# Patient Record
Sex: Male | Born: 1955 | Race: White | Hispanic: No | Marital: Married | Smoking: Never smoker
Health system: Southern US, Community
[De-identification: ages and names within clinical notes are randomized; demographics above are authoritative.]

## PROBLEM LIST (undated history)

## (undated) DIAGNOSIS — M1990 Unspecified osteoarthritis, unspecified site: Secondary | ICD-10-CM

## (undated) DIAGNOSIS — K219 Gastro-esophageal reflux disease without esophagitis: Secondary | ICD-10-CM

## (undated) DIAGNOSIS — R519 Headache, unspecified: Secondary | ICD-10-CM

## (undated) DIAGNOSIS — C801 Malignant (primary) neoplasm, unspecified: Secondary | ICD-10-CM

## (undated) DIAGNOSIS — R51 Headache: Secondary | ICD-10-CM

## (undated) DIAGNOSIS — F329 Major depressive disorder, single episode, unspecified: Secondary | ICD-10-CM

## (undated) DIAGNOSIS — E119 Type 2 diabetes mellitus without complications: Secondary | ICD-10-CM

## (undated) DIAGNOSIS — F419 Anxiety disorder, unspecified: Secondary | ICD-10-CM

## (undated) DIAGNOSIS — F32A Depression, unspecified: Secondary | ICD-10-CM

## (undated) DIAGNOSIS — I10 Essential (primary) hypertension: Secondary | ICD-10-CM

## (undated) DIAGNOSIS — Z8489 Family history of other specified conditions: Secondary | ICD-10-CM

## (undated) DIAGNOSIS — E785 Hyperlipidemia, unspecified: Secondary | ICD-10-CM

## (undated) HISTORY — PX: TIBIA FRACTURE SURGERY: SHX806

---

## 2005-06-30 ENCOUNTER — Inpatient Hospital Stay: Payer: Self-pay | Admitting: Internal Medicine

## 2005-06-30 ENCOUNTER — Other Ambulatory Visit: Payer: Self-pay

## 2005-07-03 ENCOUNTER — Ambulatory Visit: Payer: Self-pay | Admitting: Internal Medicine

## 2014-02-26 ENCOUNTER — Inpatient Hospital Stay: Payer: Self-pay | Admitting: Internal Medicine

## 2014-05-14 NOTE — Consult Note (Signed)
PATIENT NAME:  Travis Ford, Travis Ford MR#:  [id number] DATE OF BIRTH:  [birth year]  DATE OF CONSULTATION:  02/26/2014  CONSULTING PHYSICIAN:  Timoteo Gaul, MD  REASON FOR CONSULTATION: Dog bite, right hand.   HISTORY OF PRESENT ILLNESS: Mr. Stetzer is a 59 year old male who presents with redness and swelling of his right hand after being bitten by his own dog on Friday, 02/24/2014. He states he was holding a toy and he and the dog were having a tug-of-war. The dog became aggressive at going after the toy and accidentally bit the patient in the right hand along the radial border of the thumb near the thenar eminence and CMC joint. The patient states that the next day, the hand became more swollen and last night he had severe pain in the right thumb. He states he has had an upper respiratory infection for the past several days, but felt very poorly this morning which is what precipitated him to come to the emergency department.   HOME MEDICATIONS: Include metformin 500 mg 1 tablet b.i.d. and hydrochlorothiazide 1 tablet daily, hydrochlorothiazide/lisinopril combination is 12.5/10 mg,   PAST MEDICAL HISTORY: Includes hypertension of diabetes.   ALLERGIES: No known drug allergies.   SOCIAL HISTORY: The patient lives at home with his wife and kids.   PHYSICAL EXAMINATION:  GENERAL: The patient is alert and oriented, in no acute distress. He is able to give an accurate history.   VITAL SIGNS: The patient is noted to be febrile in the emergency department. His temperature is 102.4. He is tachycardic with a pulse rate of 104. His blood pressure is 112/66 and respirations of 18. There is a 93% pulse oximetry on room air.   EXTREMITIES: Right hand: The patient has swelling over the thenar eminence, wrapping around the base of the thumb to the dorsal web space. He can flex and extend the thumb and all other digits of the right hand without pain. He has wrist without pain as well. He has full elbow shoulder  range of motion. He has a streaking erythema over volar aspect of his forearm, which extends approximately three quarters of the way up his arm. The patient's forearm compartments and hand compartments are soft and compressible despite the swelling. He has a palpable radial pulse and his fingers are well-perfused. He has intact sensation to light touch in all digits.   RADIOLOGY: X-ray films of the right hand including AP, lateral and oblique were reviewed. This shows no evidence of foreign body, fracture, dislocation or other osseous abnormality.   ASSESSMENT: Right hand cellulitis with ascending erythema.   PLAN: The patient does not show evidence of a septic joint at this time in either his right thumb or wrist. I believe he has cellulitis. He has swelling, but no focal area of fluctuance consistent with an abscess. I agree with the plan of the hospitalist service admitting for IV antibiotics. I discussed this with Dr. Reece Levy, the admitting hospitalist. The patient has received clindamycin after blood cultures were taken in the emergency room. He will have Unasyn added to his antibiotics. He has a white count of 16.7. The patient also has a serum glucose of 326. He has been placed on an insulin sliding scale. The patient does not appear septic. His fever may be related to the upper respiratory infection as well. I will continue to follow him along with the hospitalist service. If he does not improve on the IV antibiotics overnight, then an MRI would be considered to  evaluate for a focal abscess that may be surgically drained. The patient understood and agreed with this plan.     ____________________________ Timoteo Gaul, MD klk:TT D: 02/26/2014 18:17:49 ET T: 02/26/2014 18:42:51 ET JOB#: [id number]  cc: Timoteo Gaul, MD, <Dictator> Timoteo Gaul MD ELECTRONICALLY SIGNED 03/08/2014 14:46

## 2014-05-14 NOTE — Consult Note (Signed)
PATIENT NAME:  Travis Ford, LOCKLIN MR#:  [id number] DATE OF BIRTH:  1955/12/02  DATE OF CONSULTATION:  02/27/2014 Relatively healthy male with DM  who was playing with his pet dog when he had a minor scratch from the dog's teeth on his right hand. This occurred 3 days ago. The area became red, swollen and painful. The patient also started to have some subjective fevers and felt nauseous. He has also had some sinus symptoms predating this. The patient was noted to have an elevated white count in the ER and was admitted for IV antibiotics. He had some streaking up his arm at that time. He was given a tetanus shot and has been started on IV antibiotics. He has been seen by Dr. Mack Guise in orthopedics. He has had an MRI done, which does not show any evidence of necrotizing fasciitis or osteomyelitis. The dog is up-to-date on his vaccines.   PAST MEDICAL HISTORY:  1. Hypertension.  2. Diabetes. Per his wife, it is poorly controlled.  3. Obesity.   PAST SURGICAL HISTORY: None.   ALLERGIES: NO KNOWN DRUG ALLERGIES.   ANTIBIOTICS SINCE ADMISSION: Include Unasyn and clindamycin.   FAMILY HISTORY: Mother with hypertension and heart problems.   SOCIAL HISTORY: He is married and lives with his wife. He is a Armed forces operational officer. He does not smoke. He uses alcohol only very occasionally.   REVIEW OF SYSTEMS: 11 systems reviewed and negative, except as per history of present illness.   PHYSICAL EXAMINATION:  VITAL SIGNS: Temperature is afebrile, pulse is 90, down from 107, blood pressure 124/72, respirations 19, saturation 94%.  GENERAL: He is obese. No acute distress.  HEENT: Pupils equal, round, and reactive to light and accommodation. Sclerae anicteric. Oropharynx clear.  NECK: Supple.  HEART: Regular.  LUNGS: Clear.  ABDOMEN: Soft, nontender, nondistended. No hepatosplenomegaly.  EXTREMITIES: With 1+ edema bilaterally. Right upper extremity, he has edema over the dorsum of his hand. There is an area  of hemorrhagic bullae over the  hypothenar area. He has a small amount of serosanguineous purulence that I am able to express. There is mild streaking up the inner ear arm, which is not extending beyond the borders drawn yesterday.   DATA: White blood count 16.7 on 02/26/2013, 16.1 on 02/27/2014. Platelets are normal. Blood cultures x2 from 02/26/2014 no growth to date. Renal function shows a creatinine of 1.32 on 02/27/2013 up from 1.19 on 02/26/2014.   IMAGING: MRI of the right hand shows cellulitis of the head without osteomyelitis or abscess.   IMPRESSION: A 59 year old gentleman with history of poorly controlled diabetes, admitted with cellulitis and streaking in the arm after a bite by a dog. There is some hemorrhagic bullae. White count is 16. He is afebrile and heart rate and blood pressure stable. MRI is negative for abscess or osteomyelitis.   RECOMMENDATIONS:  1. Continue Unasyn and clindamycin.  2. Elevate the arm.  3. As this improves, he can potentially be switched to oral Augmentin and clindamycin and discharged with a total of 10 to 14 days of antibiotics.  4. Thank you for the consult. I will be glad to follow with you.  ____________________________ Cheral Marker. Ola Spurr, MD dpf:ap D: 02/27/2014 14:38:15 ET T: 02/27/2014 15:35:36 ET JOB#: [id number]  cc: Cheral Marker. Ola Spurr, MD, <Dictator> DAVID Ola Spurr MD ELECTRONICALLY SIGNED 03/16/2014 19:46

## 2014-05-14 NOTE — Discharge Summary (Signed)
PATIENT NAME:  Travis Ford, Travis Ford MR#:  [id number] DATE OF BIRTH:  30-Dec-1955  DATE OF ADMISSION:  02/26/2014 DATE OF DISCHARGE:  03/04/2014  DISCHARGE DIAGNOSES:  1.  Hand cellulitis due to group A streptococcus.   2.  Diabetes.  3.  Hypertension.   HISTORY OF PRESENT ILLNESS: Please see initial history and physical for details. Briefly, this gentleman was admitted following a dog bite to his hand. The area had become markedly infected. The patient was admitted, started on antibiotics. He was followed by orthopedics and infectious disease. He had 2 MRIs done over the course of this hospitalization that did not reveal any focal abscess. He defervesced and his white count improved; however, he continued to have impressive infection of the hand. Decision was made to discharge on ceftriaxone and clindamycin for the group A strep, which was identified. The patient also had his diabetes and hypertension managed with his outpatient medications.    DISCHARGE MEDICATIONS: Please see [facility] physician discharge summary.   DISCHARGE FOLLOWUP: The patient will follow up with Dr. Ola Spurr and Dr. Ouida Sills within 1-2 weeks.   DISCHARGE DIET: Diabetic ADA diet.   DISCHARGE INSTRUCTIONS: Continue to elevate the arm. PICC care per protocol. IV antibiotics per antibiotic orders. He will call or return if his hand worsens.   TIME SPENT: This discharge took 35 minutes.    ____________________________ Cheral Marker. Ola Spurr, MD dpf:bm D: 03/21/2014 19:54:02 ET T: 03/22/2014 02:11:37 ET JOB#: [id number]  cc: Cheral Marker. Ola Spurr, MD, <Dictator> Deniya Craigo Ola Spurr MD ELECTRONICALLY SIGNED 03/22/2014 7:12

## 2014-05-14 NOTE — Consult Note (Signed)
Brief Consult Note: Diagnosis: Right hand cellulitis following dog bite.   Patient was seen by consultant.   Consult note dictated.   Discussed with Attending MD.   Comments: Discussed with Dr. Reece Levy.  Patient with right hand cellulitis with streaking erythema in the right volar forearm consistent with cellulltis after being bit by his dog on Friday.  Patient does not have significant pain with movement of his thumb or wrist and I do not suspect a septic joint at this time.  No surgery is indicated at this time.  I agree with medicine admitting him for IV antibiotics.  I will recheck the patient in the AM.  I am ordering that he be NPO after midnight in case his condition worsens overnight.  If he fails to respond to the IV antibiotics, an MRI will be ordered to evaluate for an abscess.  Electronic Signatures: Thornton Park (MD)  (Signed 14-Feb-16 18:08)  Authored: Brief Consult Note   Last Updated: 14-Feb-16 18:08 by Thornton Park (MD)

## 2014-05-14 NOTE — H&P (Signed)
PATIENT NAME:  Travis Ford, Travis Ford MR#:  [id number] DATE OF BIRTH:  [birth year]  DATE OF ADMISSION:  02/26/2014  REFERRING PHYSICIAN: Verdia Kuba. Paduchowski, MD  PRIMARY CARE PHYSICIAN: Ocie Cornfield. Ouida Sills, MD, of [facility].   ADMITTING PHYSICIAN: Juluis Mire, MD   CHIEF COMPLAINT: Pain, redness and swelling of the right hand following dog bite, sustained 2 days ago. Fever of 1 day duration.   HISTORY OF PRESENT ILLNESS: A 59 year old, Caucasian male with a past medical history of diabetes mellitus type 2, hypertension, presents to the emergency room with complaints of pain, swelling and redness of right hand following sustaining a dog bite 2 days ago by his own dog. The patient states that he has a minor dog bite, which was sustained on Friday, following which he developed redness, pain, and swelling, which gradually increased today; hence, came to the emergency room for further evaluation. The patient also gives a history of fever, which started today. Has had some nausea, but no vomiting. He does have some dry cough with some nasal congestion, upper respiratory symptoms for the past few days. Otherwise, denies any sputum. No chest pain. No vomiting, no diarrhea, no urinary symptoms. In the emergency room, the patient was evaluated by the ED physician and was noted to have elevated white blood cell count and elevated temperature. The hospital service was consulted for further evaluation and management. Orthopedic on-call was also consulted by the ED physician, who saw the patient and advised admission to the medical team with IV antibiotics for now and they will follow him up.   PAST MEDICAL HISTORY: 1. Diabetes mellitus, type 2.  2. Hypertension.   PAST SURGICAL HISTORY: No history of any surgeries in the past.   ALLERGIES: No known drug allergies.   HOME MEDICATIONS:  1. Lisinopril 10 mg 1 tablet orally once a day.  2. Metformin 500 mg 1 tablet orally 2 times a day.  3. Aspirin 325  mg 1 tablet orally once a day.   FAMILY HISTORY: Mother with hypertension. No history of any diabetes, heart problems.   SOCIAL HISTORY: He is married and lives with his wife. He is a Armed forces operational officer. Denies any history of smoking, alcohol or substance abuse.   REVIEW OF SYSTEMS:  CONSTITUTIONAL: Positive for fever of 1 day duration. No chills. No fatigue. No generalized weakness.   EYES: Negative for blurred vision, double vision. No pain. No redness. No discharge.   EARS, NOSE, AND THROAT: Negative for tinnitus, ear pain, hearing loss, epistaxis. He does have some mild nasal congestion.   RESPIRATORY: Positive for dry cough. No sputum. No dyspnea. No wheezing. No hemoptysis. No painful respirations.   CARDIOVASCULAR: Negative for chest pain, palpitations, dizziness, syncopal episodes, orthopnea, dyspnea on excisional exertion, or pedal edema.   GASTROINTESTINAL: Positive for nausea, but no vomiting. No diarrhea. No abdominal pain. No hematemesis. No rectal bleeding. No GERD symptoms.   GENITOURINARY: Negative for dysuria, frequency, urgency, or hematuria.   ENDOCRINE: Negative for polyuria, nocturia, heat or cold intolerance.   HEMATOLOGIC AND LYMPHATIC: Negative for anemia, easy bruising or bleeding, or swollen glands.   INTEGUMENTARY: Positive for redness with pain and swelling of the right hand as noted in the history of present illness with a streak extending up the right forearm.   MUSCULOSKELETAL: Negative for neck pain or back pain. No history of arthritis or gout.   NEUROLOGICAL: Negative for focal weakness or numbness. No history of CVA, TIA or seizure disorder.  PSYCHIATRIC: Negative for anxiety, insomnia, depression.   PHYSICAL EXAMINATION: VITAL SIGNS: Temperature on arrival 98.3 degrees Fahrenheit, current temperature 102.4 degrees Fahrenheit, pulse rate 117 per minute, respirations 18 per minute, blood pressure 132/74, oxygen saturation 98% on room air.    GENERAL: Well-developed, well-nourished. Alert and oriented, in no acute distress, comfortably resting in the bed.   HEAD: Atraumatic, normocephalic.   EYES: Pupils are equal, react to light and accommodation. No conjunctival pallor. No icterus. Extraocular movements intact.   NOSE: No drainage. No lesions.   EARS: No drainage. No external lesions.   ORAL CAVITY: No mucosal lesions. No exudates.   NECK: Supple. No JVD. No thyromegaly. No carotid bruits. Range of motion of neck within normal limits.   RESPIRATORY: Good respiratory effort. Not using accessory muscles of respiration. Bilateral vesicular breath sounds. No rales or rhonchi.   CARDIOVASCULAR: S1, S2 regular. No murmurs, gallops, clicks or rubs. The pulses equal at carotid, femoral and pedal pulses. No peripheral edema.   ABDOMEN: Soft, obese and nontender. No hepatosplenomegaly. No masses. No guarding. No rigidity. Bowel sounds present and equal in all 4 quadrants.    GENITOURINARY: Deferred.  MUSCULOSKELETAL: No joint tenderness or effusion. Range of motion is adequate. Strength and tone equal bilaterally.   SKIN: Inspection with redness with localized edema with some abrasion with bruising on the right hand present. A red streak going from the hand up the forearm, up to the elbow on the lateral side of the right arm present.   LYMPHATIC: No cervical lymphadenopathy.   VASCULAR: Good dorsalis pedis and posterior tibial pulses.   NEUROLOGIC: Alert, awake and oriented x 3. Cranial nerves 2 through 12 grossly intact. No sensory deficit. No motor deficit. Motor strength is 5/5 bilateral. Deep tendon reflexes 2+ bilateral symmetrical.   PSYCHIATRIC: Alert, awake and oriented x 3. Judgment and insight adequate. Memory and mood within normal limits.   LABORATORY DATA: Serum glucose 326, BUN 14, creatinine 1.19, sodium 132, potassium 3.7, chloride 97, bicarbonate 27, total calcium 9.3, WBC 16.7, hemoglobin 16.1, hematocrit  49.2, platelet count 202,000.   IMAGING STUDIES: X-ray of the right hand with soft tissue swelling without underlying acute bony or joint abnormality.   ASSESSMENT AND PLAN: A 59 year old, Caucasian male with a past medical history of diabetes mellitus type 2 and hypertension who presents with right hand redness, swelling, pain and fever following a dog bite sustained on 02/24/2014, noted to have elevated temperatures with leukocytosis consistent with cellulitis of the right hand.   1. Right hand cellulitis following dog bite sustained 2 days ago. The patient is stable clinically. Admit to medical floor. Blood cultures obtained. IV antibiotics Unasyn and clindamycin. Follow up temperatures. Pain control medications. Follow up CBC. The patient seen by orthopedics in the emergency department and we will keep him n.p.o. except medications, as per orthopedic's request. Follow up with orthopedics as suggested.  2. Diabetes mellitus, type 2: Stable on oral medications. Continue sliding scale insulin.  3. Hypertension, controlled on home medications. Continue same. Follow blood pressure.  4. Deep vein thrombosis prophylaxis with subcutaneous Lovenox.  5. Gastrointestinal prophylaxis: Proton pump inhibitor.   CODE STATUS: Full code.   TIME SPENT: 45 minutes.     ____________________________ Juluis Mire, MD enr:TT D: 02/26/2014 17:57:50 ET T: 02/26/2014 18:27:52 ET JOB#: [id number]  cc: Juluis Mire, MD, <Dictator> Ocie Cornfield. Ouida Sills, MD Juluis Mire MD ELECTRONICALLY SIGNED 02/27/2014 12:35

## 2015-01-14 HISTORY — PX: INCISION AND DRAINAGE: SHX5863

## 2016-02-13 ENCOUNTER — Encounter
Admission: RE | Admit: 2016-02-13 | Discharge: 2016-02-13 | Disposition: A | Discharge status: Home / Self Care | Payer: BC Managed Care – PPO | Source: Ambulatory Visit | Attending: Orthopedic Surgery | Admitting: Orthopedic Surgery

## 2016-02-13 DIAGNOSIS — M1611 Unilateral primary osteoarthritis, right hip: Secondary | ICD-10-CM | POA: Insufficient documentation

## 2016-02-13 DIAGNOSIS — R9431 Abnormal electrocardiogram [ECG] [EKG]: Secondary | ICD-10-CM | POA: Diagnosis not present

## 2016-02-13 DIAGNOSIS — Z01810 Encounter for preprocedural cardiovascular examination: Secondary | ICD-10-CM | POA: Diagnosis present

## 2016-02-13 DIAGNOSIS — Z01812 Encounter for preprocedural laboratory examination: Secondary | ICD-10-CM | POA: Insufficient documentation

## 2016-02-13 DIAGNOSIS — I10 Essential (primary) hypertension: Secondary | ICD-10-CM | POA: Insufficient documentation

## 2016-02-13 HISTORY — DX: Essential (primary) hypertension: I10

## 2016-02-13 HISTORY — DX: Malignant (primary) neoplasm, unspecified: C80.1

## 2016-02-13 HISTORY — DX: Gastro-esophageal reflux disease without esophagitis: K21.9

## 2016-02-13 HISTORY — DX: Type 2 diabetes mellitus without complications: E11.9

## 2016-02-13 HISTORY — DX: Unspecified osteoarthritis, unspecified site: M19.90

## 2016-02-13 HISTORY — DX: Family history of other specified conditions: Z84.89

## 2016-02-13 LAB — TYPE AND SCREEN
ABO/RH(D): O POS
ANTIBODY SCREEN: NEGATIVE

## 2016-02-13 LAB — BASIC METABOLIC PANEL
ANION GAP: 9 (ref 5–15)
BUN: 21 mg/dL — ABNORMAL HIGH (ref 6–20)
CHLORIDE: 99 mmol/L — AB (ref 101–111)
CO2: 30 mmol/L (ref 22–32)
Calcium: 9.4 mg/dL (ref 8.9–10.3)
Creatinine, Ser: 1.13 mg/dL (ref 0.61–1.24)
GFR calc Af Amer: >60 mL/min (ref >60)
GFR calc non Af Amer: >60 mL/min (ref >60)
Glucose, Bld: 196 mg/dL — ABNORMAL HIGH (ref 65–99)
POTASSIUM: 3.9 mmol/L (ref 3.5–5.1)
Sodium: 138 mmol/L (ref 135–145)

## 2016-02-13 LAB — PROTIME-INR
INR: 0.89
Prothrombin Time: 12 seconds (ref 11.4–15.2)

## 2016-02-13 LAB — APTT: aPTT: 31 seconds (ref 24–36)

## 2016-02-13 LAB — URINALYSIS, COMPLETE (UACMP) WITH MICROSCOPIC
BACTERIA UA: NONE SEEN
Bilirubin Urine: NEGATIVE
Glucose, UA: 50 mg/dL — AB
HGB URINE DIPSTICK: NEGATIVE
Ketones, ur: 5 mg/dL — AB
LEUKOCYTES UA: NEGATIVE
Nitrite: NEGATIVE
PROTEIN: 30 mg/dL — AB
RBC / HPF: NONE SEEN RBC/hpf (ref 0–5)
SQUAMOUS EPITHELIAL / LPF: NONE SEEN
Specific Gravity, Urine: 1.033 — ABNORMAL HIGH (ref 1.005–1.030)
pH: 5 (ref 5.0–8.0)

## 2016-02-13 LAB — SURGICAL PCR SCREEN
MRSA, PCR: NEGATIVE
STAPHYLOCOCCUS AUREUS: POSITIVE — AB

## 2016-02-13 LAB — CBC
HEMATOCRIT: 41.6 % (ref 40.0–52.0)
HEMOGLOBIN: 14.2 g/dL (ref 13.0–18.0)
MCH: 30.1 pg (ref 26.0–34.0)
MCHC: 34.1 g/dL (ref 32.0–36.0)
MCV: 88.2 fL (ref 80.0–100.0)
Platelets: 201 10*3/uL (ref 150–440)
RBC: 4.71 MIL/uL (ref 4.40–5.90)
RDW: 13.9 % (ref 11.5–14.5)
WBC: 6.1 10*3/uL (ref 3.8–10.6)

## 2016-02-13 LAB — SEDIMENTATION RATE: SED RATE: 6 mm/h (ref 0–20)

## 2016-02-13 NOTE — Patient Instructions (Signed)
Your procedure is scheduled on: February 19, 2016 TUESDAY Su procedimiento est programado para: Report to [place] [facility]TRANCE Presntese a: To find out your arrival time please call [phone] between 1PM - 3PM on February 18, 2016 MONDAY Para saber su hora de llegada por favor llame al ([place]:  Remember: Instructions that are not followed completely may result in serious medical risk, up to and including death, or upon the discretion of your surgeon and anesthesiologist your surgery may need to be rescheduled.  Recuerde: Las instrucciones que no se siguen completamente Heritage manager en un riesgo de salud grave, incluyendo hasta la [place] o a discrecin de su cirujano y Environmental health practitioner, su ciruga se puede posponer.   __X__ 1. Do not eat food or drink liquids after midnight. No gum chewing or hard candies.  No coma alimentos ni tome lquidos despus de la medianoche.  No mastique chicle ni caramelos  duros.     __X__ 2. No alcohol for 24 hours before or after surgery.    No tome alcohol durante las 24 horas antes ni despus de la [place].   __X__ 3. Bring all medications with you on the day of surgery if instructed. BRING ANY NEW MEDICATIONS    Lleve todos los medicamentos con usted el da de su ciruga si se le ha indicado as.   ____ 4. Notify your doctor if there is any change in your medical condition (cold, fever,                             infections).    Informe a su mdico si hay algn cambio en su condicin mdica (resfriado, fiebre, infecciones).   Do not wear jewelry, make-up, hairpins, clips or nail polish.  No use joyas, maquillajes, pinzas/ganchos para el cabello ni esmalte de uas.  Do not wear lotions, powders, or perfumes. You may wear deodorant.  No use lociones, polvos o perfumes.  Puede usar desodorante.    Do not shave 48 hours prior to surgery. Men may shave face and neck.  No se afeite  48 horas antes de la [place].  Los hombres pueden [employer] cara y el cuello.   Do not bring valuables to the hospital.   No lleve objetos [place] is not responsible for any belongings or valuables.  [place] no se hace responsable de ningn tipo de pertenencias u objetos de Geographical information systems officer.               Contacts, dentures or bridgework may not be worn into surgery.  Los lentes de [place], las dentaduras postizas o puentes no se pueden usar en la [place].  Leave your suitcase in the car. After surgery it may be brought to your room.  Deje su maleta en el auto.  Despus de la ciruga podr traerla a su habitacin.  For patients admitted to the hospital, discharge time is determined by your treatment team.  Para los pacientes que sean ingresados al hospital, el tiempo en el cual se le dar de alta es determinado por su                equipo de [place].   Patients discharged the day of surgery will not be allowed to drive home. A los pacientes que se les da de alta el mismo da de la ciruga no  se les permitir conducir a casa.   Please read over the following fact sheets that you were given: Por favor [place] informacin que le dieron:   MRSA EDUCATION AND CHG INSTRUCTION   ___X_ Take these medicines the morning of surgery with A SIP OF WATER:          [employer] estas medicinas la maana de la ciruga con UN SORBO DE AGUA:  1. LIPTOR  2. CYMBALTA  3. NORCO  4.       5.  6.  ____ Fleet Enema (as directed)          Enema de Fleet (segn lo indicado)    __X__ Use CHG Soap as directed          Utilice el jabn de CHG segn lo indicado  ____ Use inhalers on the day of surgery          Use los inhaladores el da de la ciruga  __X__ Stop metformin 2 days prior to surgery           Deje de tomar el metformin 2 das antes de la ciruga    __X__ Take 1/2 of usual insulin dose the night before surgery and none on the morning of surgery            Tome la mitad de la dosis habitual de insulina la noche antes de la [place] y no tome nada en la maana de la             ciruga  ____ Stop Coumadin/Plavix/aspirin on           Deje de tomar el Coumadin/Plavix/aspirina el da:  __X__ Stop Anti-inflammatories UNTIL AFTER SURGERY - ANAPROX, IBUPROFEN, ALEVE, ALKA-SELTZER, MOTRIN , ADVIL          Deje de tomar antiinflamatorios el da:   ____ Stop supplements until after surgery            Deje de tomar suplementos hasta despus de la ciruga  ____ Bring C-Pap to the hospital          [place] al hospital

## 2016-02-13 NOTE — OR Nursing (Signed)
Travis Ford in Dr Rudene Christians office notified of PCR screen positive for Staph. Results faxed to office

## 2016-02-13 NOTE — Pre-Procedure Instructions (Signed)
MEDICAL CLEARANCE REQUEST/EKG,AS INSTRUCTED BY DR Lenna Sciara ADAMS, CALLED AND FAXED TO DR Ouida Sills. SPOKE WITH LISA. ALSO FAXED AND CALLED TO CASEY AT DR [facility]

## 2016-02-14 LAB — URINE CULTURE: Culture: NO GROWTH

## 2016-02-18 MED ORDER — CEFAZOLIN SODIUM 10 G IJ SOLR
3.0000 g | Freq: Once | INTRAMUSCULAR | Status: AC
Start: 2016-02-18 — End: 2016-02-19
  Administered 2016-02-19: 3 g via INTRAVENOUS
  Filled 2016-02-18: qty 3000

## 2016-02-18 NOTE — Pre-Procedure Instructions (Signed)
CLEARED BY DR Ouida Sills. SEE OFFICE VISIT NOTE FROM 02/15/16 ON CHART

## 2016-02-19 ENCOUNTER — Encounter
Admission: RE | Disposition: A | Discharge status: Home Health | Payer: Self-pay | Source: Ambulatory Visit | Attending: Orthopedic Surgery

## 2016-02-19 ENCOUNTER — Inpatient Hospital Stay: Payer: BC Managed Care – PPO

## 2016-02-19 ENCOUNTER — Inpatient Hospital Stay: Payer: BC Managed Care – PPO | Admitting: Anesthesiology

## 2016-02-19 ENCOUNTER — Inpatient Hospital Stay
Admission: RE | Admit: 2016-02-19 | Discharge: 2016-02-21 | DRG: 470 | Disposition: A | Discharge status: Home Health | Payer: BC Managed Care – PPO | Source: Ambulatory Visit | Attending: Orthopedic Surgery | Admitting: Orthopedic Surgery

## 2016-02-19 ENCOUNTER — Encounter: Payer: Self-pay | Admitting: *Deleted

## 2016-02-19 DIAGNOSIS — Z85820 Personal history of malignant melanoma of skin: Secondary | ICD-10-CM

## 2016-02-19 DIAGNOSIS — E119 Type 2 diabetes mellitus without complications: Secondary | ICD-10-CM | POA: Diagnosis present

## 2016-02-19 DIAGNOSIS — D62 Acute posthemorrhagic anemia: Secondary | ICD-10-CM | POA: Diagnosis not present

## 2016-02-19 DIAGNOSIS — K219 Gastro-esophageal reflux disease without esophagitis: Secondary | ICD-10-CM | POA: Diagnosis present

## 2016-02-19 DIAGNOSIS — I10 Essential (primary) hypertension: Secondary | ICD-10-CM | POA: Diagnosis present

## 2016-02-19 DIAGNOSIS — Z419 Encounter for procedure for purposes other than remedying health state, unspecified: Secondary | ICD-10-CM

## 2016-02-19 DIAGNOSIS — M1611 Unilateral primary osteoarthritis, right hip: Secondary | ICD-10-CM | POA: Diagnosis present

## 2016-02-19 DIAGNOSIS — M25551 Pain in right hip: Secondary | ICD-10-CM

## 2016-02-19 DIAGNOSIS — G8918 Other acute postprocedural pain: Secondary | ICD-10-CM

## 2016-02-19 DIAGNOSIS — R262 Difficulty in walking, not elsewhere classified: Secondary | ICD-10-CM

## 2016-02-19 HISTORY — PX: TOTAL HIP ARTHROPLASTY: SHX124

## 2016-02-19 LAB — CREATININE, SERUM: CREATININE: 1.03 mg/dL (ref 0.61–1.24)

## 2016-02-19 LAB — GLUCOSE, CAPILLARY
GLUCOSE-CAPILLARY: 184 mg/dL — AB (ref 65–99)
Glucose-Capillary: 241 mg/dL — ABNORMAL HIGH (ref 65–99)
Glucose-Capillary: 232 mg/dL — ABNORMAL HIGH (ref 65–99)
Glucose-Capillary: 229 mg/dL — ABNORMAL HIGH (ref 65–99)
Glucose-Capillary: 191 mg/dL — ABNORMAL HIGH (ref 65–99)

## 2016-02-19 LAB — CBC
HCT: 41.9 % (ref 40.0–52.0)
HEMOGLOBIN: 13.9 g/dL (ref 13.0–18.0)
MCH: 29.7 pg (ref 26.0–34.0)
MCHC: 33.1 g/dL (ref 32.0–36.0)
MCV: 89.6 fL (ref 80.0–100.0)
Platelets: 200 10*3/uL (ref 150–440)
RBC: 4.68 MIL/uL (ref 4.40–5.90)
RDW: 14.6 % — ABNORMAL HIGH (ref 11.5–14.5)
WBC: 8 10*3/uL (ref 3.8–10.6)

## 2016-02-19 LAB — ABO/RH: ABO/RH(D): O POS

## 2016-02-19 SURGERY — ARTHROPLASTY, HIP, TOTAL, ANTERIOR APPROACH
Anesthesia: Spinal | Laterality: Right | Wound class: Clean

## 2016-02-19 MED ORDER — INSULIN ASPART 100 UNIT/ML ~~LOC~~ SOLN
0.0000 [IU] | Freq: Three times a day (TID) | SUBCUTANEOUS | Status: DC
Start: 2016-02-20 — End: 2016-02-21
  Administered 2016-02-20: 5 [IU] via SUBCUTANEOUS
  Administered 2016-02-20 – 2016-02-21 (×4): 3 [IU] via SUBCUTANEOUS
  Filled 2016-02-19: qty 5
  Filled 2016-02-19 (×4): qty 3

## 2016-02-19 MED ORDER — GLYCOPYRROLATE 0.2 MG/ML IJ SOLN
INTRAMUSCULAR | Status: AC
  Filled 2016-02-19: qty 1

## 2016-02-19 MED ORDER — ZOLPIDEM TARTRATE 5 MG PO TABS
5.0000 mg | ORAL_TABLET | Freq: Every evening | ORAL | Status: DC | PRN
  Administered 2016-02-20: 5 mg via ORAL
  Filled 2016-02-19 (×2): qty 1

## 2016-02-19 MED ORDER — OXYCODONE HCL 5 MG PO TABS
5.0000 mg | ORAL_TABLET | ORAL | Status: DC | PRN
  Administered 2016-02-19: 5 mg via ORAL
  Administered 2016-02-19 – 2016-02-21 (×7): 10 mg via ORAL
  Filled 2016-02-19 (×7): qty 2

## 2016-02-19 MED ORDER — HYDROMORPHONE HCL 1 MG/ML IJ SOLN
1.0000 mg | INTRAMUSCULAR | Status: DC | PRN
  Administered 2016-02-19 (×2): 0.5 mg via INTRAVENOUS
  Filled 2016-02-19: qty 1

## 2016-02-19 MED ORDER — ONDANSETRON HCL 4 MG/2ML IJ SOLN
4.0000 mg | Freq: Four times a day (QID) | INTRAMUSCULAR | Status: DC | PRN
  Administered 2016-02-19: 4 mg via INTRAVENOUS
  Filled 2016-02-19: qty 2

## 2016-02-19 MED ORDER — MAGNESIUM CITRATE PO SOLN
1.0000 | Freq: Once | ORAL | Status: AC | PRN
  Administered 2016-02-21: 1 via ORAL
  Filled 2016-02-19 (×2): qty 296

## 2016-02-19 MED ORDER — HYDROCHLOROTHIAZIDE 12.5 MG PO CAPS
12.5000 mg | ORAL_CAPSULE | Freq: Every day | ORAL | Status: DC
  Administered 2016-02-20 – 2016-02-21 (×2): 12.5 mg via ORAL
  Filled 2016-02-19 (×2): qty 1

## 2016-02-19 MED ORDER — DIPHENHYDRAMINE HCL 12.5 MG/5ML PO ELIX: 12.5000 mg | ORAL_SOLUTION | ORAL | Status: DC | PRN

## 2016-02-19 MED ORDER — NEOMYCIN-POLYMYXIN B GU 40-200000 IR SOLN
Status: DC | PRN
  Administered 2016-02-19: 4 mL

## 2016-02-19 MED ORDER — FAMOTIDINE 20 MG PO TABS
ORAL_TABLET | ORAL | Status: AC
  Administered 2016-02-19: 20 mg via ORAL
  Filled 2016-02-19: qty 1

## 2016-02-19 MED ORDER — MIDAZOLAM HCL 5 MG/5ML IJ SOLN
INTRAMUSCULAR | Status: DC | PRN
  Administered 2016-02-19: 2 mg via INTRAVENOUS

## 2016-02-19 MED ORDER — ACETAMINOPHEN 325 MG PO TABS: 650.0000 mg | ORAL_TABLET | Freq: Four times a day (QID) | ORAL | Status: DC | PRN

## 2016-02-19 MED ORDER — CLONAZEPAM 0.5 MG PO TABS
1.0000 mg | ORAL_TABLET | Freq: Every day | ORAL | Status: DC | PRN
Start: 2016-02-19 — End: 2016-02-21
  Administered 2016-02-19: 1 mg via ORAL
  Filled 2016-02-19: qty 2

## 2016-02-19 MED ORDER — PROPOFOL 500 MG/50ML IV EMUL
INTRAVENOUS | Status: AC
  Filled 2016-02-19: qty 50

## 2016-02-19 MED ORDER — ATORVASTATIN CALCIUM 20 MG PO TABS
40.0000 mg | ORAL_TABLET | Freq: Every day | ORAL | Status: DC
  Administered 2016-02-20 – 2016-02-21 (×2): 40 mg via ORAL
  Filled 2016-02-19 (×2): qty 2

## 2016-02-19 MED ORDER — BUPIVACAINE HCL (PF) 0.5 % IJ SOLN
INTRAMUSCULAR | Status: AC
  Filled 2016-02-19: qty 10

## 2016-02-19 MED ORDER — PHENOL 1.4 % MT LIQD
1.0000 | OROMUCOSAL | Status: DC | PRN
  Filled 2016-02-19: qty 177

## 2016-02-19 MED ORDER — KETOROLAC TROMETHAMINE 30 MG/ML IJ SOLN
30.0000 mg | Freq: Once | INTRAMUSCULAR | Status: AC
  Administered 2016-02-19: 30 mg via INTRAVENOUS
  Filled 2016-02-19: qty 1

## 2016-02-19 MED ORDER — ALUM & MAG HYDROXIDE-SIMETH 200-200-20 MG/5ML PO SUSP: 30.0000 mL | ORAL | Status: DC | PRN

## 2016-02-19 MED ORDER — MORPHINE SULFATE (PF) 2 MG/ML IV SOLN: 2.0000 mg | INTRAVENOUS | Status: DC | PRN

## 2016-02-19 MED ORDER — ONDANSETRON HCL 4 MG PO TABS: 4.0000 mg | ORAL_TABLET | Freq: Four times a day (QID) | ORAL | Status: DC | PRN

## 2016-02-19 MED ORDER — PROPOFOL 10 MG/ML IV BOLUS
INTRAVENOUS | Status: AC
  Filled 2016-02-19: qty 20

## 2016-02-19 MED ORDER — METFORMIN HCL 500 MG PO TABS
1000.0000 mg | ORAL_TABLET | Freq: Two times a day (BID) | ORAL | Status: DC
  Administered 2016-02-20 – 2016-02-21 (×3): 1000 mg via ORAL
  Filled 2016-02-19 (×4): qty 2

## 2016-02-19 MED ORDER — FENTANYL CITRATE (PF) 100 MCG/2ML IJ SOLN
INTRAMUSCULAR | Status: AC
  Administered 2016-02-19: 25 ug via INTRAVENOUS
  Filled 2016-02-19: qty 2

## 2016-02-19 MED ORDER — BISACODYL 10 MG RE SUPP
10.0000 mg | Freq: Every day | RECTAL | Status: DC | PRN
  Administered 2016-02-21: 10 mg via RECTAL
  Filled 2016-02-19: qty 1

## 2016-02-19 MED ORDER — BUPIVACAINE-EPINEPHRINE 0.25% -1:200000 IJ SOLN
INTRAMUSCULAR | Status: DC | PRN
  Administered 2016-02-19: 30 mL

## 2016-02-19 MED ORDER — HYDROMORPHONE HCL 1 MG/ML IJ SOLN
1.0000 mg | INTRAMUSCULAR | Status: DC | PRN
  Administered 2016-02-19 – 2016-02-20 (×2): 2 mg via INTRAVENOUS
  Filled 2016-02-19 (×2): qty 2

## 2016-02-19 MED ORDER — FENTANYL CITRATE (PF) 100 MCG/2ML IJ SOLN
25.0000 ug | INTRAMUSCULAR | Status: DC | PRN
  Administered 2016-02-19 (×4): 25 ug via INTRAVENOUS

## 2016-02-19 MED ORDER — PROPOFOL 10 MG/ML IV BOLUS
INTRAVENOUS | Status: DC | PRN
  Administered 2016-02-19: 70 mg via INTRAVENOUS
  Administered 2016-02-19: 30 mg via INTRAVENOUS

## 2016-02-19 MED ORDER — EPHEDRINE 5 MG/ML INJ
INTRAVENOUS | Status: AC
  Filled 2016-02-19: qty 10

## 2016-02-19 MED ORDER — SODIUM CHLORIDE 0.9 % IV SOLN
INTRAVENOUS | Status: DC | PRN
  Administered 2016-02-19: 25 ug/min via INTRAVENOUS

## 2016-02-19 MED ORDER — METHOCARBAMOL 1000 MG/10ML IJ SOLN
500.0000 mg | Freq: Four times a day (QID) | INTRAVENOUS | Status: DC | PRN
  Filled 2016-02-19: qty 5

## 2016-02-19 MED ORDER — BUPIVACAINE HCL (PF) 0.5 % IJ SOLN
INTRAMUSCULAR | Status: DC | PRN
  Administered 2016-02-19: 3.5 mL

## 2016-02-19 MED ORDER — DULOXETINE HCL 60 MG PO CPEP
60.0000 mg | ORAL_CAPSULE | Freq: Every day | ORAL | Status: DC
  Administered 2016-02-20 – 2016-02-21 (×2): 60 mg via ORAL
  Filled 2016-02-19 (×2): qty 1

## 2016-02-19 MED ORDER — ACETAMINOPHEN 650 MG RE SUPP: 650.0000 mg | Freq: Four times a day (QID) | RECTAL | Status: DC | PRN

## 2016-02-19 MED ORDER — SODIUM CHLORIDE 0.9 % IV BOLUS (SEPSIS)
500.0000 mL | Freq: Once | INTRAVENOUS | Status: AC
  Administered 2016-02-19: 500 mL via INTRAVENOUS

## 2016-02-19 MED ORDER — LISINOPRIL-HYDROCHLOROTHIAZIDE 10-12.5 MG PO TABS: 1.0000 | ORAL_TABLET | Freq: Every day | ORAL | Status: DC

## 2016-02-19 MED ORDER — DEXTROSE 5 % IV SOLN
3.0000 g | Freq: Four times a day (QID) | INTRAVENOUS | Status: AC
  Administered 2016-02-19 – 2016-02-20 (×3): 3 g via INTRAVENOUS
  Filled 2016-02-19 (×3): qty 3000

## 2016-02-19 MED ORDER — ENOXAPARIN SODIUM 40 MG/0.4ML ~~LOC~~ SOLN
40.0000 mg | SUBCUTANEOUS | Status: DC
  Administered 2016-02-20 – 2016-02-21 (×2): 40 mg via SUBCUTANEOUS
  Filled 2016-02-19 (×2): qty 0.4

## 2016-02-19 MED ORDER — MAGNESIUM HYDROXIDE 400 MG/5ML PO SUSP
30.0000 mL | Freq: Every day | ORAL | Status: DC | PRN
  Administered 2016-02-20: 30 mL via ORAL
  Filled 2016-02-19: qty 30

## 2016-02-19 MED ORDER — PROPOFOL 500 MG/50ML IV EMUL
INTRAVENOUS | Status: DC | PRN
  Administered 2016-02-19: 100 ug/kg/min via INTRAVENOUS

## 2016-02-19 MED ORDER — BUPIVACAINE-EPINEPHRINE (PF) 0.25% -1:200000 IJ SOLN
INTRAMUSCULAR | Status: AC
  Filled 2016-02-19: qty 30

## 2016-02-19 MED ORDER — EPHEDRINE SULFATE 50 MG/ML IJ SOLN
INTRAMUSCULAR | Status: DC | PRN
  Administered 2016-02-19 (×2): 10 mg via INTRAVENOUS

## 2016-02-19 MED ORDER — TRANEXAMIC ACID 1000 MG/10ML IV SOLN
1000.0000 mg | INTRAVENOUS | Status: AC
  Filled 2016-02-19 (×2): qty 10

## 2016-02-19 MED ORDER — KETOROLAC TROMETHAMINE 15 MG/ML IJ SOLN
15.0000 mg | Freq: Four times a day (QID) | INTRAMUSCULAR | Status: AC
Start: 2016-02-20 — End: 2016-02-20
  Administered 2016-02-20 (×3): 15 mg via INTRAVENOUS
  Filled 2016-02-19 (×3): qty 1

## 2016-02-19 MED ORDER — INFLUENZA VAC SPLIT QUAD 0.5 ML IM SUSY
0.5000 mL | PREFILLED_SYRINGE | INTRAMUSCULAR | Status: AC
  Administered 2016-02-21: 0.5 mL via INTRAMUSCULAR
  Filled 2016-02-19: qty 0.5

## 2016-02-19 MED ORDER — FAMOTIDINE 20 MG PO TABS
20.0000 mg | ORAL_TABLET | Freq: Once | ORAL | Status: AC
  Administered 2016-02-19: 20 mg via ORAL

## 2016-02-19 MED ORDER — LISINOPRIL 10 MG PO TABS
10.0000 mg | ORAL_TABLET | Freq: Every day | ORAL | Status: DC
  Administered 2016-02-20 – 2016-02-21 (×2): 10 mg via ORAL
  Filled 2016-02-19 (×2): qty 1

## 2016-02-19 MED ORDER — SODIUM CHLORIDE 0.9 % IV SOLN
INTRAVENOUS | Status: DC
  Administered 2016-02-20: 04:00:00 via INTRAVENOUS

## 2016-02-19 MED ORDER — ONDANSETRON HCL 4 MG/2ML IJ SOLN: 4.0000 mg | Freq: Once | INTRAMUSCULAR | Status: DC | PRN

## 2016-02-19 MED ORDER — DOCUSATE SODIUM 100 MG PO CAPS
100.0000 mg | ORAL_CAPSULE | Freq: Two times a day (BID) | ORAL | Status: DC
  Administered 2016-02-19 – 2016-02-21 (×4): 100 mg via ORAL
  Filled 2016-02-19 (×4): qty 1

## 2016-02-19 MED ORDER — INSULIN GLARGINE 100 UNIT/ML ~~LOC~~ SOLN
22.0000 [IU] | Freq: Every day | SUBCUTANEOUS | Status: DC
  Administered 2016-02-19 – 2016-02-20 (×2): 22 [IU] via SUBCUTANEOUS
  Filled 2016-02-19 (×3): qty 0.22

## 2016-02-19 MED ORDER — METOCLOPRAMIDE HCL 10 MG PO TABS: 5.0000 mg | ORAL_TABLET | Freq: Three times a day (TID) | ORAL | Status: DC | PRN

## 2016-02-19 MED ORDER — TRAMADOL HCL 50 MG PO TABS
50.0000 mg | ORAL_TABLET | ORAL | Status: DC | PRN
  Administered 2016-02-19 – 2016-02-21 (×5): 50 mg via ORAL
  Filled 2016-02-19 (×5): qty 1

## 2016-02-19 MED ORDER — NEOMYCIN-POLYMYXIN B GU 40-200000 IR SOLN
Status: AC
  Filled 2016-02-19: qty 4

## 2016-02-19 MED ORDER — SODIUM CHLORIDE 0.9 % IV SOLN
INTRAVENOUS | Status: DC
  Administered 2016-02-19: 15:00:00 via INTRAVENOUS
  Administered 2016-02-19: 50 mL/h via INTRAVENOUS
  Administered 2016-02-19: 16:00:00 via INTRAVENOUS

## 2016-02-19 MED ORDER — METOCLOPRAMIDE HCL 5 MG/ML IJ SOLN: 5.0000 mg | Freq: Three times a day (TID) | INTRAMUSCULAR | Status: DC | PRN

## 2016-02-19 MED ORDER — METHOCARBAMOL 500 MG PO TABS
500.0000 mg | ORAL_TABLET | Freq: Four times a day (QID) | ORAL | Status: DC | PRN
  Administered 2016-02-19 – 2016-02-20 (×2): 500 mg via ORAL
  Filled 2016-02-19 (×2): qty 1

## 2016-02-19 MED ORDER — MIDAZOLAM HCL 2 MG/2ML IJ SOLN
INTRAMUSCULAR | Status: AC
  Filled 2016-02-19: qty 2

## 2016-02-19 MED ORDER — MENTHOL 3 MG MT LOZG
1.0000 | LOZENGE | OROMUCOSAL | Status: DC | PRN
  Filled 2016-02-19: qty 9

## 2016-02-19 SURGICAL SUPPLY — 44 items
BLADE SAW SAG 18.5X105 (BLADE) ×3 IMPLANT
BNDG COHESIVE 6X5 TAN STRL LF (GAUZE/BANDAGES/DRESSINGS) ×6 IMPLANT
CANISTER SUCT 1200ML W/VALVE (MISCELLANEOUS) ×3 IMPLANT
CAPT HIP TOTAL 3 ×3 IMPLANT
CATH FOL LEG HOLDER (MISCELLANEOUS) ×3 IMPLANT
CATH TRAY METER 16FR LF (MISCELLANEOUS) ×3 IMPLANT
CHLORAPREP W/TINT 26ML (MISCELLANEOUS) ×3 IMPLANT
DRAPE C-ARM XRAY 36X54 (DRAPES) ×3 IMPLANT
DRAPE INCISE IOBAN 66X60 STRL (DRAPES) IMPLANT
DRAPE POUCH INSTRU U-SHP 10X18 (DRAPES) ×3 IMPLANT
DRAPE SHEET LG 3/4 BI-LAMINATE (DRAPES) ×9 IMPLANT
DRAPE TABLE BACK 80X90 (DRAPES) ×3 IMPLANT
DRSG OPSITE POSTOP 4X8 (GAUZE/BANDAGES/DRESSINGS) ×6 IMPLANT
ELECT BLADE 6.5 EXT (BLADE) ×3 IMPLANT
ELECT REM PT RETURN 9FT ADLT (ELECTROSURGICAL) ×3
ELECTRODE REM PT RTRN 9FT ADLT (ELECTROSURGICAL) ×1 IMPLANT
GLOVE BIOGEL PI IND STRL 9 (GLOVE) ×1 IMPLANT
GLOVE BIOGEL PI INDICATOR 9 (GLOVE) ×2
GLOVE SURG SYN 9.0  PF PI (GLOVE) ×4
GLOVE SURG SYN 9.0 PF PI (GLOVE) ×2 IMPLANT
GOWN SRG 2XL LVL 4 RGLN SLV (GOWNS) ×1 IMPLANT
GOWN STRL NON-REIN 2XL LVL4 (GOWNS) ×2
GOWN STRL REUS W/ TWL LRG LVL3 (GOWN DISPOSABLE) ×1 IMPLANT
GOWN STRL REUS W/TWL LRG LVL3 (GOWN DISPOSABLE) ×2
HEMOVAC 400CC 10FR (MISCELLANEOUS) IMPLANT
HOOD PEEL AWAY FLYTE STAYCOOL (MISCELLANEOUS) ×3 IMPLANT
MAT BLUE FLOOR 46X72 FLO (MISCELLANEOUS) ×3 IMPLANT
NDL SAFETY 18GX1.5 (NEEDLE) ×3 IMPLANT
NEEDLE SPNL 18GX3.5 QUINCKE PK (NEEDLE) ×3 IMPLANT
NS IRRIG 1000ML POUR BTL (IV SOLUTION) ×3 IMPLANT
PACK HIP COMPR (MISCELLANEOUS) ×3 IMPLANT
SOL PREP PVP 2OZ (MISCELLANEOUS) ×3
SOLUTION PREP PVP 2OZ (MISCELLANEOUS) ×1 IMPLANT
STAPLER SKIN PROX 35W (STAPLE) ×3 IMPLANT
STRAP SAFETY BODY (MISCELLANEOUS) ×3 IMPLANT
SUT DVC 2 QUILL PDO  T11 36X36 (SUTURE) ×2
SUT DVC 2 QUILL PDO T11 36X36 (SUTURE) ×1 IMPLANT
SUT SILK 0 (SUTURE) ×2
SUT SILK 0 30XBRD TIE 6 (SUTURE) ×1 IMPLANT
SUT V-LOC 90 ABS DVC 3-0 CL (SUTURE) ×3 IMPLANT
SUT VIC AB 1 CT1 36 (SUTURE) ×3 IMPLANT
SYR 20CC LL (SYRINGE) ×3 IMPLANT
SYR 30ML LL (SYRINGE) ×3 IMPLANT
TOWEL OR 17X26 4PK STRL BLUE (TOWEL DISPOSABLE) ×3 IMPLANT

## 2016-02-19 NOTE — H&P (Signed)
Reviewed paper H+P, will be scanned into chart. No changes noted.  

## 2016-02-19 NOTE — Transfer of Care (Signed)
Immediate Anesthesia Transfer of Care Note  Patient: Travis Ford  Procedure(s) Performed: Procedure(s): TOTAL HIP ARTHROPLASTY ANTERIOR APPROACH (Right)  Patient Location: PACU  Anesthesia Type:Spinal  Level of Consciousness: awake and sedated  Airway & Oxygen Therapy: Patient Spontanous Breathing and Patient connected to face mask oxygen  Post-op Assessment: Report given to RN and Post -op Vital signs reviewed and stable  Post vital signs: Reviewed and stable  Last Vitals:  Vitals:   02/19/16 1332  BP: (!) 155/92  Pulse: 93  Resp: 16  Temp: 36.2 C    Last Pain:  Vitals:   02/19/16 1332  TempSrc: Tympanic  PainSc: 4       Patients Stated Pain Goal: 0 ([id number])  Complications: No apparent anesthesia complications

## 2016-02-19 NOTE — Op Note (Signed)
02/19/2016  4:48 PM  PATIENT:  Travis Ford  61 y.o. male  PRE-OPERATIVE DIAGNOSIS:  primary osteoarthritis right hip  POST-OPERATIVE DIAGNOSIS:  primary osteoarthritis right hip  PROCEDURE:  Procedure(s): TOTAL HIP ARTHROPLASTY ANTERIOR APPROACH (Right)  SURGEON: Laurene Footman, MD  ASSISTANTS: None   ANESTHESIA:   spinal  EBL:  Total I/O In: 1200 [I.V.:1200] Out: 400 [Urine:100; Blood:300]  BLOOD ADMINISTERED:none  DRAINS: none   LOCAL MEDICATIONS USED:  MARCAINE     SPECIMEN:  Source of Specimen:  Right femoral head  DISPOSITION OF SPECIMEN:  PATHOLOGY  COUNTS:  YES  TOURNIQUET:  * No tourniquets in log *  IMPLANTS: Medacta AMIS 3 lateralized stem with 56 mm Mpact DM cup and liner with M 28 mm head  DICTATION: .Dragon Dictation   The patient was brought to the operating room and after spinal anesthesia was obtained patient was placed on the operative table with the ipsilateral foot into the Medacta attachment, contralateral leg on a well-padded table. C-arm was brought in and preop template x-ray taken. After prepping and draping in usual sterile fashion appropriate patient identification and timeout procedures were completed. Anterior approach to the hip was obtained and centered over the greater trochanter and TFL muscle. The subcutaneous tissue was incised hemostasis being achieved by electrocautery. TFL fascia was incised and the muscle retracted laterally deep retractor placed. The lateral femoral circumflex vessels were identified and ligated. The anterior capsule was exposed and a capsulotomy performed. The neck was identified and a femoral neck cut carried out with a saw. The head was removed without difficulty and showed sclerotic femoral head and acetabulum. Reaming was carried out to 56 mm and a 56 mm cup trial gave appropriate tightness to the acetabular component a 56 DM cup was impacted into position. The leg was then externally rotated and ischiofemoral and  pubofemoral releases carried out. The femur was sequentially broached to a size 3, size 3 lateralized with S then M head trials were placed and the final components chosen. The 3 lateralized stem was inserted along with a M 28 mm head and 56 mm liner. The hip was reduced and was stable the wound was thoroughly irrigated. The deep fascia was closed using a heavy Quill after infiltration of 30 cc of quarter percent Sensorcaine with epinephrine. 3-0 v-loc was used subcutaneously to bring the skin edges together and then skin staples were used to close the skin. Xeroform and honeycomb dressing then applied  PLAN OF CARE: Admit to inpatient

## 2016-02-19 NOTE — OR Nursing (Signed)
Patient arrived diaphoretic, nauseated, red faced and in a lot of pain. Blood sugar upon arrival was 241. After investigating, patient had taken a pain pill shortly before arriving and was dehydrated.  Cold cloth to head. Anesthesia aware of blood sugar but feels that it is most likely due to anxiety and stress.  Also, his wife trimmed his toes last night and cut the nail on his right big toe too low. Once bandage was  removed, the nail region looks clean and without drainage.

## 2016-02-19 NOTE — Anesthesia Preprocedure Evaluation (Signed)
Anesthesia Evaluation  Patient identified by MRN, date of birth, ID band Patient awake    Reviewed: Allergy & Precautions, NPO status , Patient's Chart, lab work & pertinent test results  History of Anesthesia Complications Negative for: history of anesthetic complications  Airway Mallampati: II       Dental   Pulmonary neg pulmonary ROS,           Cardiovascular hypertension, Pt. on medications      Neuro/Psych negative neurological ROS     GI/Hepatic Neg liver ROS, GERD  Medicated,  Endo/Other  diabetes, Type 2, Oral Hypoglycemic Agents, Insulin Dependent  Renal/GU negative Renal ROS     Musculoskeletal   Abdominal   Peds  Hematology   Anesthesia Other Findings   Reproductive/Obstetrics                            Anesthesia Physical Anesthesia Plan  ASA: II  Anesthesia Plan: Spinal   Post-op Pain Management:    Induction:   Airway Management Planned:   Additional Equipment:   Intra-op Plan:   Post-operative Plan:   Informed Consent: I have reviewed the patients History and Physical, chart, labs and discussed the procedure including the risks, benefits and alternatives for the proposed anesthesia with the patient or authorized representative who has indicated his/her understanding and acceptance.     Plan Discussed with:   Anesthesia Plan Comments:         Anesthesia Quick Evaluation

## 2016-02-19 NOTE — Anesthesia Procedure Notes (Signed)
Date/Time: 02/19/2016 3:33 PM Performed by: Nelda Marseille Pre-anesthesia Checklist: Patient identified, Emergency Drugs available, Suction available, Patient being monitored and Timeout performed Oxygen Delivery Method: Simple face mask

## 2016-02-19 NOTE — Anesthesia Post-op Follow-up Note (Cosign Needed)
Anesthesia QCDR form completed.        

## 2016-02-19 NOTE — Anesthesia Procedure Notes (Signed)
Spinal  Patient location during procedure: OR Start time: 02/19/2016 2:55 PM End time: 02/19/2016 3:05 PM Staffing Resident/CRNA: Nelda Marseille Performed: resident/CRNA  Preanesthetic Checklist Completed: patient identified, site marked, surgical consent, pre-op evaluation, timeout performed, IV checked, risks and benefits discussed and monitors and equipment checked Spinal Block Patient position: sitting Prep: Betadine Patient monitoring: heart rate, continuous pulse ox, blood pressure and cardiac monitor Approach: midline Location: L3-4 Injection technique: single-shot Needle Needle type: Whitacre and Introducer  Needle gauge: 25 G Needle length: 9 cm Additional Notes Negative paresthesia. Negative blood return. Positive free-flowing CSF. Expiration date of kit checked and confirmed. Patient tolerated procedure well, without complications.

## 2016-02-20 ENCOUNTER — Encounter: Payer: Self-pay | Admitting: Orthopedic Surgery

## 2016-02-20 LAB — BASIC METABOLIC PANEL
Anion gap: 5 (ref 5–15)
BUN: 16 mg/dL (ref 6–20)
CO2: 27 mmol/L (ref 22–32)
CREATININE: 0.98 mg/dL (ref 0.61–1.24)
Calcium: 8.5 mg/dL — ABNORMAL LOW (ref 8.9–10.3)
Chloride: 104 mmol/L (ref 101–111)
Glucose, Bld: 181 mg/dL — ABNORMAL HIGH (ref 65–99)
POTASSIUM: 4.2 mmol/L (ref 3.5–5.1)
SODIUM: 136 mmol/L (ref 135–145)

## 2016-02-20 LAB — GLUCOSE, CAPILLARY
GLUCOSE-CAPILLARY: 160 mg/dL — AB (ref 65–99)
GLUCOSE-CAPILLARY: 165 mg/dL — AB (ref 65–99)
Glucose-Capillary: 240 mg/dL — ABNORMAL HIGH (ref 65–99)
Glucose-Capillary: 165 mg/dL — ABNORMAL HIGH (ref 65–99)

## 2016-02-20 LAB — CBC
HCT: 35.6 % — ABNORMAL LOW (ref 40.0–52.0)
Hemoglobin: 12.1 g/dL — ABNORMAL LOW (ref 13.0–18.0)
MCH: 30.3 pg (ref 26.0–34.0)
MCHC: 34 g/dL (ref 32.0–36.0)
MCV: 89.2 fL (ref 80.0–100.0)
PLATELETS: 188 10*3/uL (ref 150–440)
RBC: 3.99 MIL/uL — AB (ref 4.40–5.90)
RDW: 14 % (ref 11.5–14.5)
WBC: 6.7 10*3/uL (ref 3.8–10.6)

## 2016-02-20 NOTE — Anesthesia Postprocedure Evaluation (Signed)
Anesthesia Post Note  Patient: Travis Ford  Procedure(s) Performed: Procedure(s) (LRB): TOTAL HIP ARTHROPLASTY ANTERIOR APPROACH (Right)  Patient location during evaluation: Other Anesthesia Type: Spinal Level of consciousness: oriented and awake and alert Pain management: pain level controlled Vital Signs Assessment: post-procedure vital signs reviewed and stable Respiratory status: spontaneous breathing, respiratory function stable and patient connected to nasal cannula oxygen Cardiovascular status: blood pressure returned to baseline and stable Postop Assessment: no headache and no backache Anesthetic complications: no     Last Vitals:  Vitals:   02/20/16 0002 02/20/16 0445  BP: 124/68 112/73  Pulse: 75 (!) 59  Resp: 18 18  Temp: 37.2 C 36.6 C    Last Pain:  Vitals:   02/20/16 0448  TempSrc:   PainSc: 8                  Alison Stalling

## 2016-02-20 NOTE — Progress Notes (Signed)
   Subjective: 1 Day Post-Op Procedure(s) (LRB): TOTAL HIP ARTHROPLASTY ANTERIOR APPROACH (Right) Patient reports pain as mild.   Patient is well, and has had no acute complaints or problems Denies any CP, SOB, ABD pain. We will start therapy today.  Plan is to go Home after hospital stay.  Objective: Vital signs in last 24 hours: Temp:  [97.2 F (36.2 C)-99 F (37.2 C)] 97.9 F (36.6 C) (02/07 0445) Pulse Rate:  [45-93] 59 (02/07 0445) Resp:  [11-18] 18 (02/07 0445) BP: (89-155)/(53-92) 112/73 (02/07 0445) SpO2:  [93 %-100 %] 97 % (02/07 0445) Weight:  [115.7 kg (255 lb)-121.1 kg (267 lb)] 115.7 kg (255 lb) (02/06 1822)  Intake/Output from previous day: 02/06 0701 - 02/07 0700 In: 1400 [I.V.:1400] Out: 1140 [Urine:840; Blood:300] Intake/Output this shift: No intake/output data recorded.   Recent Labs  02/19/16 1859 02/20/16 0510  HGB 13.9 12.1*    Recent Labs  02/19/16 1859 02/20/16 0510  WBC 8.0 6.7  RBC 4.68 3.99*  HCT 41.9 35.6*  PLT 200 188    Recent Labs  02/19/16 1859 02/20/16 0510  NA  --  136  K  --  4.2  CL  --  104  CO2  --  27  BUN  --  16  CREATININE 1.03 0.98  GLUCOSE  --  181*  CALCIUM  --  8.5*   No results for input(s): LABPT, INR in the last 72 hours.  EXAM General - Patient is Alert, Appropriate and Oriented Extremity - Neurovascular intact Sensation intact distally Intact pulses distally Dorsiflexion/Plantar flexion intact No cellulitis present Compartment soft Dressing - dressing C/D/I and no drainage Motor Function - intact, moving foot and toes well on exam.   Past Medical History:  Diagnosis Date  . Arthritis   . Cancer ([place])    melanoma on right arm  . Diabetes mellitus without complication ([place])   . Family history of adverse reaction to anesthesia    mother gets really sick with anesthesia  . GERD (gastroesophageal reflux disease)    uses alka seltzer  . Hypertension     Assessment/Plan:   1 Day Post-Op  Procedure(s) (LRB): TOTAL HIP ARTHROPLASTY ANTERIOR APPROACH (Right) Active Problems:   Primary localized osteoarthritis of right hip   Acute post op blood loss anemia   Estimated body mass index is 34.58 kg/m as calculated from the following:   Height as of this encounter: 6' (1.829 m).   Weight as of this encounter: 115.7 kg (255 lb). Advance diet Up with therapy  Recheck labs in the am Needs BM CM to assist with discharge  DVT Prophylaxis - Lovenox, Foot Pumps and TED hose Weight-Bearing as tolerated to right leg   T. Rachelle Hora, PA-C [facility] 02/20/2016, 7:45 AM

## 2016-02-20 NOTE — Progress Notes (Signed)
Clinical Social Worker (CSW) received SNF consult. PT is recommending outpatient PT. RN case manager aware of above. Please reconsult if future social work needs arise. CSW signing off.   Kayin Kettering, LCSW [phone]  

## 2016-02-20 NOTE — Progress Notes (Signed)
Physical Therapy Treatment Patient Details Name: Travis Ford MRN: [id number] DOB: [birth year] Today's Date: 02/20/2016    History of Present Illness admitted of acute hospitalization status post R THA (02/20/16), anterior appoach, WBAT     PT Comments    Patient with excellent progress towards mobility goals; completed gait around nursing station with RW, cga/close sup.  Good confidence in mobility; good WBing R LE.  Generally slower with all functional mobiltiy (10' walk time, 12-13 seconds), though no buckling or LOB, no safety concern noted. Will plan to trial stairs next session.  Follow Up Recommendations  Outpatient PT     Equipment Recommendations       Recommendations for Other Services       Precautions / Restrictions Precautions Precautions: Fall;Anterior Hip Restrictions Weight Bearing Restrictions: Yes RLE Weight Bearing: Weight bearing as tolerated    Mobility  Bed Mobility Overal bed mobility: Modified Independent             General bed mobility comments: able to negotiate bilat LEs over edge of bed without assist this date  Transfers Overall transfer level: Needs assistance Equipment used: Rolling walker (2 wheeled) Transfers: Sit to/from Stand Sit to Stand: Supervision;Min guard            Ambulation/Gait Ambulation/Gait assistance: Min guard;Supervision Ambulation Distance (Feet): 220 Feet Assistive device: Rolling walker (2 wheeled)   Gait velocity: 10' walk time, 12-13 seconds   General Gait Details: reciprocal stepping pattern with slow, but steady, cadence and overall gait performance.  Good confidence in movement; good awareness of limtiations   Stairs            Wheelchair Mobility    Modified [name] (Stroke Patients Only)       Balance                                    Cognition Arousal/Alertness: Awake/alert Behavior During Therapy: WFL for tasks assessed/performed Overall Cognitive Status:  Within Functional Limits for tasks assessed                      Exercises Other Exercises Other Exercises: Toilet transfer, ambulatory with RW, cga/close sup; standing balance for urinal use, close sup.  Good static standing balance; good awareness of limits of stability.    General Comments        Pertinent Vitals/Pain Pain Assessment: 0-10 Pain Score: 4  Pain Location: R hip Pain Descriptors / Indicators: Aching;Grimacing;Guarding    Home Living                      Prior Function            PT Goals (current goals can now be found in the care plan section) Acute Rehab PT Goals Patient Stated Goal: to return home PT Goal Formulation: With patient/family Time For Goal Achievement: 03/05/16 Potential to Achieve Goals: Good Progress towards PT goals: Progressing toward goals    Frequency    BID      PT Plan Current plan remains appropriate    Co-evaluation             End of Session Equipment Utilized During Treatment: Gait belt Activity Tolerance: Patient tolerated treatment well Patient left: in chair;with call bell/phone within reach;with chair alarm set;with family/visitor present     Time: 1350-1415 PT Time Calculation (min) (ACUTE ONLY): 25 min  Charges:  $  Gait Training: 8-22 mins $Therapeutic Activity: 8-22 mins                    G Codes:      Gailen Venne H. Owens Shark, PT, DPT, NCS 02/20/16, 11:12 PM [phone]

## 2016-02-20 NOTE — Progress Notes (Signed)
Pt requested pain medication, received toradol and tramadol, and this pt dc'd ivf as pt has urinated. Pt assisted back to bed with walker, ice reapplied to hip. Pt stated that most of his pain is in his R groin due to incision being pressured.

## 2016-02-20 NOTE — Care Management (Signed)
Pt recommending outpatient PT. Need orthopedist to order in Epic please.

## 2016-02-20 NOTE — Progress Notes (Signed)
Pt alert and oriented. Called on call MD to notify that pt was unable to achieve pain control with pain medications. New orders received. Pt pain is now under control and he is resting. Can wiggle toes and has full sensation. Pedal pulses 2+ bilaterally.

## 2016-02-20 NOTE — Evaluation (Signed)
Occupational Therapy Evaluation Patient Details Name: Travis Ford MRN: [id number] DOB: Jan 29, 1955 Today's Date: 02/20/2016    History of Present Illness 61 yo male pt s/p R THA with anterior approach with no reported complications. PMHx significant for arthritis, cancer (melanoma on RUE), DM, GERD, family hx of adverse reaction to anesthesia, and HTN.   Clinical Impression   Pt seen for OT evaluation this date. Pt independent at baseline with driving, working, modified independent with AE for LB dressing, and requiring minimal assist from spouse for LB bathing due to hip pain. 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 OT recommending pt initially use shower chair at home once cleared for seated bathing and use LH sponge to maximize safety and functional independence. No additional OT follow up at this time. Will sign off.    Follow Up Recommendations  No OT follow up    Equipment Recommendations  Other (comment) (long handled sponge)    Recommendations for Other Services       Precautions / Restrictions Precautions Precautions: Fall Restrictions Weight Bearing Restrictions: Yes RLE Weight Bearing: Weight bearing as tolerated      Mobility Bed Mobility Overal bed mobility: Modified Independent             General bed mobility comments: additional time and use of bed rails to perform supine>sit EOB, no physical assist required   Transfers Overall transfer level: Needs assistance Equipment used: Rolling walker (2 wheeled) Transfers: Sit to/from Stand Sit to Stand: Min guard         General transfer comment: min guard with verbal cues for hand placement to maximize safety    Balance Overall balance assessment: Needs assistance Sitting-balance support: Feet supported Sitting balance-Leahy Scale: Normal     Standing balance support: Bilateral upper extremity supported;During functional  activity Standing balance-Leahy Scale: Good                              ADL Overall ADL's : Needs assistance/impaired Eating/Feeding: Set up;Sitting   Grooming: Set up;Sitting   Upper Body Bathing: Set up;Sitting   Lower Body Bathing: Minimal assistance;Sitting/lateral leans;Sit to/from stand;With adaptive equipment   Upper Body Dressing : Sitting;Set up   Lower Body Dressing: Minimal assistance;With adaptive equipment;Sitting/lateral leans;Sit to/from stand   Toilet Transfer: Min guard;BSC;RW           Functional mobility during ADLs: Min guard General ADL Comments: Overall min assist for LB ADL, min guard for functional mobility     Vision Vision Assessment?: No apparent visual deficits   Perception     Praxis Praxis Praxis tested?: Within functional limits    Pertinent Vitals/Pain Pain Assessment: 0-10 Pain Score: 7  Pain Location: R hip, took pain meds approx 10 minutes prior to OT evaluation Pain Descriptors / Indicators: Tightness;Aching Pain Intervention(s): Limited activity within patient's tolerance;Monitored during session;Premedicated before session;Repositioned     Hand Dominance     Extremity/Trunk Assessment Upper Extremity Assessment Upper Extremity Assessment: Overall WFL for tasks assessed   Lower Extremity Assessment Lower Extremity Assessment: Defer to PT evaluation;RLE deficits/detail   Cervical / Trunk Assessment Cervical / Trunk Assessment: Normal   Communication Communication Communication: No difficulties   Cognition Arousal/Alertness: Awake/alert Behavior During Therapy: WFL for tasks assessed/performed Overall Cognitive Status: Within Functional Limits for tasks assessed  General Comments       Exercises   Other Exercises Other Exercises: Pt/spouse educated in home modifications to support falls prevention and functional independence with ADL   Shoulder Instructions      Home  Living Family/patient expects to be discharged to:: Private residence Living Arrangements: Spouse/significant other Available Help at Discharge: Family;Available 24 hours/day Type of Home: House Home Access: Stairs to enter [employer] of Steps: 4 STE Entrance Stairs-Rails: Right Home Layout: One level     Bathroom Shower/Tub: Tub/shower unit;Walk-in shower;Curtain (pt uses walk-in) Shower/tub characteristics: Curtain Biochemist, clinical: Handicapped height Bathroom Accessibility: Yes How Accessible: Accessible via walker Home Equipment: Cane - single point;Cane - quad;Walker - 2 wheels;Bedside commode;Shower seat;Grab bars - toilet;Grab bars - tub/shower;Hand held shower head;Adaptive equipment Adaptive Equipment: Reacher;Sock aid;Long-handled shoe horn        Prior Functioning/Environment Level of Independence: Needs assistance  Gait / Transfers Assistance Needed: Indep with functional mobility ADL's / Homemaking Assistance Needed: Pt has been using AE for LB dressing tasks for a while due to hip pain; wife assists with foot care (toenails, washing feet)   Comments: Pt driving, works as a Engineer, 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        OT Problem List:     OT Treatment/Interventions:      OT Goals(Current goals can be found in the care plan section)    OT Frequency:     Barriers to D/C:            Co-evaluation              End of Session Equipment Utilized During Treatment: Gait belt;Rolling walker  Activity Tolerance: Patient tolerated treatment well Patient left: in chair;with call bell/phone within reach;with family/visitor present;with SCD's reapplied   Time: [id number] OT Time Calculation (min): 38 min Charges:  OT General Charges $OT Visit: 1 Procedure OT Evaluation $OT Eval Low Complexity: 1 Procedure OT Treatments $Self Care/Home Management : 23-37 mins G-Codes:    Corky Sox, OTR/L 02/20/2016, 10:29 AM

## 2016-02-20 NOTE — Progress Notes (Signed)
Shift assessment completed at 0730. Pt received tramadol po with early am meds, is c/o pain to r hip of 8/10. Skin is warm and dry, lungs are clear bilat and pt is on room air. Hr is regular, abdomen is soft with bs hyperactive. R hip has honeycomb dressing in place, ice bags in place, pt has full sensation to his bilat legs and is able to move his toes, ppp, foot pumps in place. PIV #20 intact to top of pt's l hand, iv ns infusing at 17mls/hr, site is free of redness and swelling. This writer told pt that ivf can be dc'd when pt urinates, urinal at bedside in reach. Since assessment, pt requested and received oxycodone at 0900, and pt has walked with physical therapy, is now oob to recliner. Call bell in reach.

## 2016-02-20 NOTE — Evaluation (Signed)
Physical Therapy Evaluation Patient Details Name: Travis Ford MRN: [id number] DOB: [birth year] Today's Date: 02/20/2016   History of Present Illness  admitted of acute hospitalization status post R THA (02/20/16), anterior appoach, WBAT   Clinical Impression  Upon evaluation, patient alert and oriented; follows all commands and demonstrates good insight/safety awareness into all functional activities. R LE generally guarded (due to pain), but demonstrates fair/good post-op strength and ROM with therex and mobility assessment.  Able to complete sit/stand, basic transfers and gait (73') with RW, cga/close sup.  Fair/good weight acceptance R LE without buckling or LOB.  Excellent effort with all therapeutic interventions; anticipate consistent progress towards all mobility goals throughout remaining hospitalization. Would benefit from skilled PT to address above deficits and promote optimal return to PLOF; recommend transition to home with outpatient PT follow up upon discharge.    Follow Up Recommendations Outpatient PT    Equipment Recommendations       Recommendations for Other Services       Precautions / Restrictions Precautions Precautions: Fall Restrictions Weight Bearing Restrictions: Yes RLE Weight Bearing: Weight bearing as tolerated      Mobility  Bed Mobility Overal bed mobility: Modified Independent             General bed mobility comments: seated in recliner beginning/end of treatment session  Transfers Overall transfer level: Needs assistance Equipment used: Rolling walker (2 wheeled) Transfers: Sit to/from Stand Sit to Stand: Min guard;Supervision         General transfer comment: fair carry-over of cuing for hand placement with movement transitions (received during OT session); good foot placement wiht fair/good WBing R LE noted  Ambulation/Gait Ambulation/Gait assistance: Min guard;Supervision Ambulation Distance (Feet): 75 Feet Assistive device:  Rolling walker (2 wheeled)       General Gait Details: step to progessingto step through gait pattern with good stance time/weight acceptance R LE; no buckling or LOB.  Slow, but steady, cadence.  Good effort throughout.  Stairs            Wheelchair Mobility    Modified [name] (Stroke Patients Only)       Balance Overall balance assessment: Needs assistance Sitting-balance support: No upper extremity supported;Feet supported Sitting balance-Leahy Scale: Normal     Standing balance support: Bilateral upper extremity supported Standing balance-Leahy Scale: Good                               Pertinent Vitals/Pain Pain Assessment: 0-10 Pain Score: 5  Pain Location: R hip Pain Descriptors / Indicators: Aching;Grimacing;Guarding Pain Intervention(s): Limited activity within patient's tolerance;Monitored during session;Repositioned;Premedicated before session    Home Living Family/patient expects to be discharged to:: Private residence Living Arrangements: Spouse/significant other Available Help at Discharge: Family;Available 24 hours/day Type of Home: House Home Access: Stairs to enter Entrance Stairs-Rails: Right Entrance Stairs-Number of Steps: 4 STE Home Layout: One level Home Equipment: Cane - single point;Cane - quad;Walker - 2 wheels;Bedside commode;Shower seat;Grab bars - toilet;Grab bars - tub/shower;Hand held shower head;Adaptive equipment      Prior Function Level of Independence: Independent   Gait / Transfers Assistance Needed: Indep with functional mobility  ADL's / Homemaking Assistance Needed: Pt has been using AE for LB dressing tasks for a while due to hip pain; wife assists with foot care (toenails, washing feet)  Comments: Indep with ADLs, household and community mobility without assist device; working full-time as Careers information officer at [employer]. +  driving; denies fall history.     Hand Dominance        Extremity/Trunk Assessment    Upper Extremity Assessment Upper Extremity Assessment: Overall WFL for tasks assessed    Lower Extremity Assessment Lower Extremity Assessment:  (R hip grossly 3-/5, limited by pain; L LE grossly WFL.  Sensation intact and symmetrical)    Cervical / Trunk Assessment Cervical / Trunk Assessment: Normal  Communication   Communication: No difficulties  Cognition Arousal/Alertness: Awake/alert Behavior During Therapy: WFL for tasks assessed/performed Overall Cognitive Status: Within Functional Limits for tasks assessed                      General Comments      Exercises Other Exercises Other Exercises: Seated LE therex, 1x10, AROM for muscular strength/endurance with functional activities: ankle pumps, LAQs, iso hip adduct, maching.  Good R hip strength/ROM with isolated therex.   Assessment/Plan    PT Assessment Patient needs continued PT services  PT Problem List Decreased strength;Decreased range of motion;Decreased activity tolerance;Decreased balance;Decreased mobility;Decreased knowledge of use of DME;Decreased safety awareness;Decreased knowledge of precautions;Pain          PT Treatment Interventions DME instruction;Gait training;Stair training;Functional mobility training;Therapeutic activities;Therapeutic exercise;Balance training;Patient/family education    PT Goals (Current goals can be found in the Care Plan section)  Acute Rehab PT Goals Patient Stated Goal: to return home PT Goal Formulation: With patient/family Time For Goal Achievement: 03/05/16 Potential to Achieve Goals: Good    Frequency BID   Barriers to discharge        Co-evaluation               End of Session Equipment Utilized During Treatment: Gait belt Activity Tolerance: Patient tolerated treatment well Patient left: in chair;with call bell/phone within reach;with chair alarm set Nurse Communication: Mobility status         Time: [id number] PT Time Calculation (min)  (ACUTE ONLY): 19 min   Charges:   PT Evaluation $PT Eval Low Complexity: 1 Procedure PT Treatments $Therapeutic Exercise: 8-22 mins   PT G Codes:        [name], PT, DPT, NCS 02/20/16, 12:20 PM [phone]

## 2016-02-21 LAB — CBC
HEMATOCRIT: 34.4 % — AB (ref 40.0–52.0)
Hemoglobin: 11.8 g/dL — ABNORMAL LOW (ref 13.0–18.0)
MCH: 30.4 pg (ref 26.0–34.0)
MCHC: 34.3 g/dL (ref 32.0–36.0)
MCV: 88.6 fL (ref 80.0–100.0)
PLATELETS: 172 10*3/uL (ref 150–440)
RBC: 3.88 MIL/uL — ABNORMAL LOW (ref 4.40–5.90)
RDW: 14 % (ref 11.5–14.5)
WBC: 6 10*3/uL (ref 3.8–10.6)

## 2016-02-21 LAB — BASIC METABOLIC PANEL
Anion gap: 4 — ABNORMAL LOW (ref 5–15)
BUN: 13 mg/dL (ref 6–20)
CALCIUM: 8.6 mg/dL — AB (ref 8.9–10.3)
CO2: 27 mmol/L (ref 22–32)
CREATININE: 0.86 mg/dL (ref 0.61–1.24)
Chloride: 100 mmol/L — ABNORMAL LOW (ref 101–111)
GFR calc Af Amer: >60 mL/min (ref >60)
GLUCOSE: 169 mg/dL — AB (ref 65–99)
Potassium: 3.8 mmol/L (ref 3.5–5.1)
Sodium: 131 mmol/L — ABNORMAL LOW (ref 135–145)

## 2016-02-21 LAB — SURGICAL PATHOLOGY

## 2016-02-21 LAB — GLUCOSE, CAPILLARY
Glucose-Capillary: 154 mg/dL — ABNORMAL HIGH (ref 65–99)
Glucose-Capillary: 199 mg/dL — ABNORMAL HIGH (ref 65–99)

## 2016-02-21 MED ORDER — ENOXAPARIN SODIUM 40 MG/0.4ML ~~LOC~~ SOLN: 40.0000 mg | SUBCUTANEOUS | 0 refills | Status: AC

## 2016-02-21 MED ORDER — OXYCODONE HCL 5 MG PO TABS: 5.0000 mg | ORAL_TABLET | ORAL | 0 refills | Status: AC | PRN

## 2016-02-21 MED ORDER — DOCUSATE SODIUM 100 MG PO CAPS: 100.0000 mg | ORAL_CAPSULE | Freq: Two times a day (BID) | ORAL | 0 refills | Status: AC

## 2016-02-21 MED ORDER — TRAMADOL HCL 50 MG PO TABS: 50.0000 mg | ORAL_TABLET | ORAL | 0 refills | Status: DC | PRN

## 2016-02-21 NOTE — Progress Notes (Signed)
Patient discharge summary reviewed with patient and spouse, with verbal understanding. Narcotic Rxs and Lovenox kit given upon discharge. Escorted to personal vehicle via wc by ortho staff

## 2016-02-21 NOTE — Care Management Note (Signed)
Case Management Note  Patient Details  Name: ORVILE CORONA MRN: [id number] Date of Birth: 08/29/1955.   Subjective/Objective:   POD # 2 right THA. Met with patients wife at bedside. Patient was out walking with PT. Wife states she has no way to get patient to outpatient PT and would like home health. Offered choice of home health agencies. Referral to [facility] for HHPT. PCP: Governor Specking. Pharmacy: [facility] [phone]. Called Lovenox 40 mg # 14, no refills. Patient has a walker. No DME needs.                 Action/Plan:   Expected Discharge Date:  02/21/16               Expected Discharge Plan:  [place]  In-House Referral:     Discharge planning Services  CM Consult  Post Acute Care Choice:  Home Health Choice offered to:  Spouse  DME Arranged:    DME Agency:     HH Arranged:  PT [place]:  [facility] (now [facility])  Status of Service:  Completed, signed off  If discussed at [employer] of Stay Meetings, dates discussed:    Additional Comments:  Jolly Mango, RN 02/21/2016, 9:55 AM

## 2016-02-21 NOTE — Progress Notes (Signed)
Subjective: 2 Days Post-Op Procedure(s) (LRB): TOTAL HIP ARTHROPLASTY ANTERIOR APPROACH (Right) Patient reports pain as mild.   Patient is well, and has had no acute complaints or problems Denies any CP, SOB, ABD pain. We will continue with therapy today.  Plan is to go Home after hospital stay.  Objective: Vital signs in last 24 hours: Temp:  [97.6 F (36.4 C)-98.4 F (36.9 C)] 98.4 F (36.9 C) (02/08 0739) Pulse Rate:  [73-80] 80 (02/08 0739) Resp:  [18-19] 18 (02/08 0739) BP: (108-125)/(66-69) 119/68 (02/08 0739) SpO2:  [95 %-99 %] 96 % (02/08 0739)  Intake/Output from previous day: 02/07 0701 - 02/08 0700 In: 480 [P.O.:480] Out: 1450 [Urine:1450] Intake/Output this shift: No intake/output data recorded.   Recent Labs  02/19/16 1859 02/20/16 0510 02/21/16 0545  HGB 13.9 12.1* 11.8*    Recent Labs  02/20/16 0510 02/21/16 0545  WBC 6.7 6.0  RBC 3.99* 3.88*  HCT 35.6* 34.4*  PLT 188 172    Recent Labs  02/20/16 0510 02/21/16 0545  NA 136 131*  K 4.2 3.8  CL 104 100*  CO2 27 27  BUN 16 13  CREATININE 0.98 0.86  GLUCOSE 181* 169*  CALCIUM 8.5* 8.6*   No results for input(s): LABPT, INR in the last 72 hours.  EXAM General - Patient is Alert, Appropriate and Oriented Extremity - Neurovascular intact Sensation intact distally Intact pulses distally Dorsiflexion/Plantar flexion intact No cellulitis present Compartment soft Dressing - dressing C/D/I and no drainage Motor Function - intact, moving foot and toes well on exam.   Past Medical History:  Diagnosis Date  . Arthritis   . Cancer ([place])    melanoma on right arm  . Diabetes mellitus without complication ([place])   . Family history of adverse reaction to anesthesia    mother gets really sick with anesthesia  . GERD (gastroesophageal reflux disease)    uses alka seltzer  . Hypertension     Assessment/Plan:   2 Days Post-Op Procedure(s) (LRB): TOTAL HIP ARTHROPLASTY ANTERIOR APPROACH  (Right) Active Problems:   Primary localized osteoarthritis of right hip   Acute post op blood loss anemia   Estimated body mass index is 34.58 kg/m as calculated from the following:   Height as of this encounter: 6' (1.829 m).   Weight as of this encounter: 115.7 kg (255 lb). Advance diet Up with therapy  Needs BM Discharge home with HHPT today pending BM and completion of stairs. Patient will need HHPT, unable to drive or find transportation to outpatient PT. Follow up with [facility] in 2 weeks  DVT Prophylaxis - Lovenox, Foot Pumps and TED hose Weight-Bearing as tolerated to right leg   T. Rachelle Hora, PA-C [place] 02/21/2016, 8:56 AM

## 2016-02-21 NOTE — Discharge Summary (Signed)
Physician Discharge Summary  Patient ID: Travis Ford MRN: [id number] DOB/AGE: 04-02-1955 61 y.o.  Admit date: 02/19/2016 Discharge date: 02/21/2016  Admission Diagnoses:  primary osteoarthritis   Discharge Diagnoses: Patient Active Problem List   Diagnosis Date Noted  . Primary localized osteoarthritis of right hip 02/19/2016    Past Medical History:  Diagnosis Date  . Arthritis   . Cancer ([place])    melanoma on right arm  . Diabetes mellitus without complication ([place])   . Family history of adverse reaction to anesthesia    mother gets really sick with anesthesia  . GERD (gastroesophageal reflux disease)    uses alka seltzer  . Hypertension      Transfusion: none   Consultants (if any):   Discharged Condition: Improved  Hospital Course: JAISON SCHELLE is an 61 y.o. male who was admitted 02/19/2016 with a diagnosis of left hip osteoarthritis and went to the operating room on 02/19/2016 and underwent the above named procedures.    Surgeries: Procedure(s): TOTAL HIP ARTHROPLASTY ANTERIOR APPROACH on 02/19/2016 Patient tolerated the surgery well. Taken to PACU where she was stabilized and then transferred to the orthopedic floor.  Started on Lovenox 40 q 24 hrs. Foot pumps applied bilaterally at 80 mm. Heels elevated on bed with rolled towels. No evidence of DVT. Negative Homan. Physical therapy started on day #1 for gait training and transfer. OT started day #1 for ADL and assisted devices.  Patient's foley was d/c on day #1. Patient's IV  was d/c on day #2.  On post op day #2 patient was stable and ready for discharge to home with HHPT.  Implants: Medacta AMIS 3 lateralized stem with 56 mm Mpact DM cup and liner with M 28 mm head  He was given perioperative antibiotics:  Anti-infectives    Start     Dose/Rate Route Frequency Ordered Stop   02/19/16 1900  ceFAZolin (ANCEF) 3 g in dextrose 5 % 50 mL IVPB     3 g 130 mL/hr over 30 Minutes Intravenous Every 6 hours 02/19/16  1815 02/20/16 0802   02/18/16 2200  ceFAZolin (ANCEF) 3 g in dextrose 5 % 50 mL IVPB     3 g 130 mL/hr over 30 Minutes Intravenous  Once 02/18/16 2155 02/19/16 1517    .  He was given sequential compression devices, early ambulation, and Lovenox  for DVT prophylaxis.  He benefited maximally from the hospital stay and there were no complications.    Recent vital signs:  Vitals:   02/21/16 0435 02/21/16 0739  BP: 108/68 119/68  Pulse: 79 80  Resp: 18 18  Temp: 98.3 F (36.8 C) 98.4 F (36.9 C)    Recent laboratory studies:  Lab Results  Component Value Date   HGB 11.8 (L) 02/21/2016   HGB 12.1 (L) 02/20/2016   HGB 13.9 02/19/2016   Lab Results  Component Value Date   WBC 6.0 02/21/2016   PLT 172 02/21/2016   Lab Results  Component Value Date   INR 0.89 02/13/2016   Lab Results  Component Value Date   NA 131 (L) 02/21/2016   K 3.8 02/21/2016   CL 100 (L) 02/21/2016   CO2 27 02/21/2016   BUN 13 02/21/2016   CREATININE 0.86 02/21/2016   GLUCOSE 169 (H) 02/21/2016    Discharge Medications:   Allergies as of 02/21/2016   No Known Allergies     Medication List    STOP taking these medications   celecoxib 100 MG  capsule Commonly known as:  CELEBREX   HYDROcodone-acetaminophen 5-325 MG tablet Commonly known as:  NORCO/VICODIN   ibuprofen 200 MG tablet Commonly known as:  ADVIL,MOTRIN     TAKE these medications   atorvastatin 40 MG tablet Commonly known as:  LIPITOR Take 40 mg by mouth daily.   clonazePAM 1 MG tablet Commonly known as:  KLONOPIN Take 1 mg by mouth daily as needed for anxiety.   docusate sodium 100 MG capsule Commonly known as:  COLACE Take 1 capsule (100 mg total) by mouth 2 (two) times daily.   DULoxetine 60 MG capsule Commonly known as:  CYMBALTA Take 60 mg by mouth daily.   enoxaparin 40 MG/0.4ML injection Commonly known as:  LOVENOX Inject 0.4 mLs (40 mg total) into the skin daily. Start taking on:  02/22/2016   LANTUS  SOLOSTAR 100 UNIT/ML Solostar Pen Generic drug:  Insulin Glargine Inject 22 Units into the skin at bedtime.   lisinopril-hydrochlorothiazide 10-12.5 MG tablet Commonly known as:  PRINZIDE,ZESTORETIC Take 1 tablet by mouth daily.   metFORMIN 1000 MG tablet Commonly known as:  GLUCOPHAGE Take 1,000 mg by mouth 2 (two) times daily.   naproxen sodium 220 MG tablet Commonly known as:  ANAPROX Take 440 mg by mouth daily as needed (pain).   oxyCODONE 5 MG immediate release tablet Commonly known as:  Oxy IR/ROXICODONE Take 1-2 tablets (5-10 mg total) by mouth every 3 (three) hours as needed for breakthrough pain.   sodium-potassium bicarbonate Tbef dissolvable tablet Commonly known as:  ALKA-SELTZER GOLD Take 2 tablets by mouth daily as needed (indigestion).   traMADol 50 MG tablet Commonly known as:  ULTRAM Take 1 tablet (50 mg total) by mouth every 4 (four) hours as needed for severe pain.            Durable Medical Equipment        Start     Ordered   02/19/16 1816  DME Walker rolling  Once    Question:  Patient needs a walker to treat with the following condition  Answer:  Status post total hip replacement, right   02/19/16 1815   02/19/16 1816  DME 3 n 1  Once     02/19/16 1815   02/19/16 1816  DME Bedside commode  Once    Question:  Patient needs a bedside commode to treat with the following condition  Answer:  Status post total hip replacement, right   02/19/16 1815      Diagnostic Studies: Dg Hip Operative Unilat With Pelvis Right  Result Date: 02/19/2016 CLINICAL DATA:  Right hip arthroplasty. EXAM: OPERATIVE RIGHT HIP (WITH PELVIS IF PERFORMED) TECHNIQUE: Fluoroscopic spot image(s) were submitted for interpretation post-operatively. COMPARISON:  None. FINDINGS: Three intraoperative images from right hip arthroplasty demonstrate placement of 3 component prosthetic right hip with anatomic alignment. No evidence of immediate complications. No evidence of fracture.  IMPRESSION: Post right hip arthroplasty without evidence of immediate complications. Electronically Signed   By: Fidela Salisbury M.D.   On: 02/19/2016 16:42   Dg Hip Unilat W Or W/o Pelvis 2-3 Views Right  Result Date: 02/19/2016 CLINICAL DATA:  Status post anterior right hip replacement EXAM: DG HIP (WITH OR WITHOUT PELVIS) 2-3V RIGHT COMPARISON:  Intraoperative right hip radiographs from earlier today FINDINGS: Status post right total hip arthroplasty, with well-positioned right acetabular and right proximal femoral prostheses, with no evidence of hardware fracture or loosening. No right hip dislocation. No acute osseous fracture. No suspicious focal osseous lesions. Expected  soft tissue gas surrounding the right hip joint. Skin staples overlie the right hip. IMPRESSION: Satisfactory immediate postoperative appearance status post right total hip arthroplasty. Electronically Signed   By: Ilona Sorrel M.D.   On: 02/19/2016 17:17    Disposition:     Follow-up Information    MENZ,MICHAEL, MD Follow up in 2 week(s).   Specialty:  Orthopedic Surgery Contact information: [place] [address] [phone]            Signed: Dorise Hiss [facility] 02/21/2016, 9:00 AM

## 2016-02-21 NOTE — Discharge Instructions (Signed)

## 2016-02-21 NOTE — Progress Notes (Signed)
Physical Therapy Treatment Patient Details Name: Travis Ford MRN: [id number] DOB: 01-14-1956 Today's Date: 02/21/2016    History of Present Illness admitted of acute hospitalization status post R THA (02/20/16), anterior appoach, WBAT     PT Comments    Patient with good progression towards all mobility goals this AM; able to complete gait around nursing station and initiate stair training without difficulty.  Min cuing for postural and R hip extension with mobility tasks; able to increase cadence and overall speed of movement when cued.  Voices awareness and understanding of technique for car transfers and accessing elevated bed surface in home environment.  No additional questions/concerns regarding upcoming discharge at this time.   Follow Up Recommendations  Outpatient PT     Equipment Recommendations       Recommendations for Other Services       Precautions / Restrictions Precautions Precautions: Fall;Anterior Hip Restrictions Weight Bearing Restrictions: Yes RLE Weight Bearing: Weight bearing as tolerated    Mobility  Bed Mobility               General bed mobility comments: seated in recliner beginning/end of treatment session  Transfers Overall transfer level: Needs assistance Equipment used: Rolling walker (2 wheeled) Transfers: Sit to/from Stand Sit to Stand: Modified independent (Device/Increase time)         General transfer comment: requires UE support to assist with lift off (and slower movement pace); good overall stability; decreased active use of R LE this AM due to pain  Ambulation/Gait Ambulation/Gait assistance: Supervision Ambulation Distance (Feet): 220 Feet Assistive device: Rolling walker (2 wheeled)       General Gait Details: reciprocal steppinig pattern with good step height/length bilat; min cuing for postural (and R hip) extension, increased cadence and gait speed   Stairs Stairs: Yes   Stair Management: Step to  pattern Number of Stairs: 4 General stair comments: initial trial with bilat handrails, cga/close sup, x4 steps; second trial with bilat UEs on single (R ascending rail), cga/min assist, x4 steps.  Min cuing for technique; fair/good stability in periods of modified R LE SLS  Wheelchair Mobility    Modified [name] (Stroke Patients Only)       Balance                                    Cognition Arousal/Alertness: Awake/alert Behavior During Therapy: WFL for tasks assessed/performed Overall Cognitive Status: Within Functional Limits for tasks assessed                      Exercises Other Exercises Other Exercises: Verbally reviewed and therapist demonstrated technique for car transfer and for getting in/out of bed with small step (aerobic step or something similar) and RW; patient/wife voiced understanding and agreement.    General Comments        Pertinent Vitals/Pain Pain Assessment: No/denies pain Pain Score: 8  Pain Location: R hip Pain Descriptors / Indicators: Tightness;Aching Pain Intervention(s): Limited activity within patient's tolerance;Monitored during session;Patient requesting pain meds-RN notified;Repositioned    Home Living                      Prior Function            PT Goals (current goals can now be found in the care plan section) Acute Rehab PT Goals Patient Stated Goal: to return home PT Goal Formulation:  With patient/family Time For Goal Achievement: 03/05/16 Potential to Achieve Goals: Good Progress towards PT goals: Progressing toward goals    Frequency    BID      PT Plan Current plan remains appropriate    Co-evaluation             End of Session Equipment Utilized During Treatment: Gait belt Activity Tolerance: Patient tolerated treatment well Patient left: in chair;with call bell/phone within reach;with chair alarm set;with family/visitor present     Time: [id number] PT Time  Calculation (min) (ACUTE ONLY): 24 min  Charges:  $Gait Training: 23-37 mins                    G Codes:      Aimee Heldman H. Owens Shark, PT, DPT, NCS 02/21/16, 10:44 AM [id number]

## 2016-04-10 IMAGING — CR RIGHT HAND - COMPLETE 3+ VIEW
1 series · 3 of 3 positions shown · non-contrast
Comparison: None.

CLINICAL DATA: Status post dog bite right hand 02/24/2014. Right
hand pain and swelling. Initial encounter.

EXAM:
RIGHT HAND - COMPLETE 3+ VIEW

[Series 1: dxr hand rt complete w/obliques · 0.14mm/px · 3 of 3 slices shown]
[im 1/3]
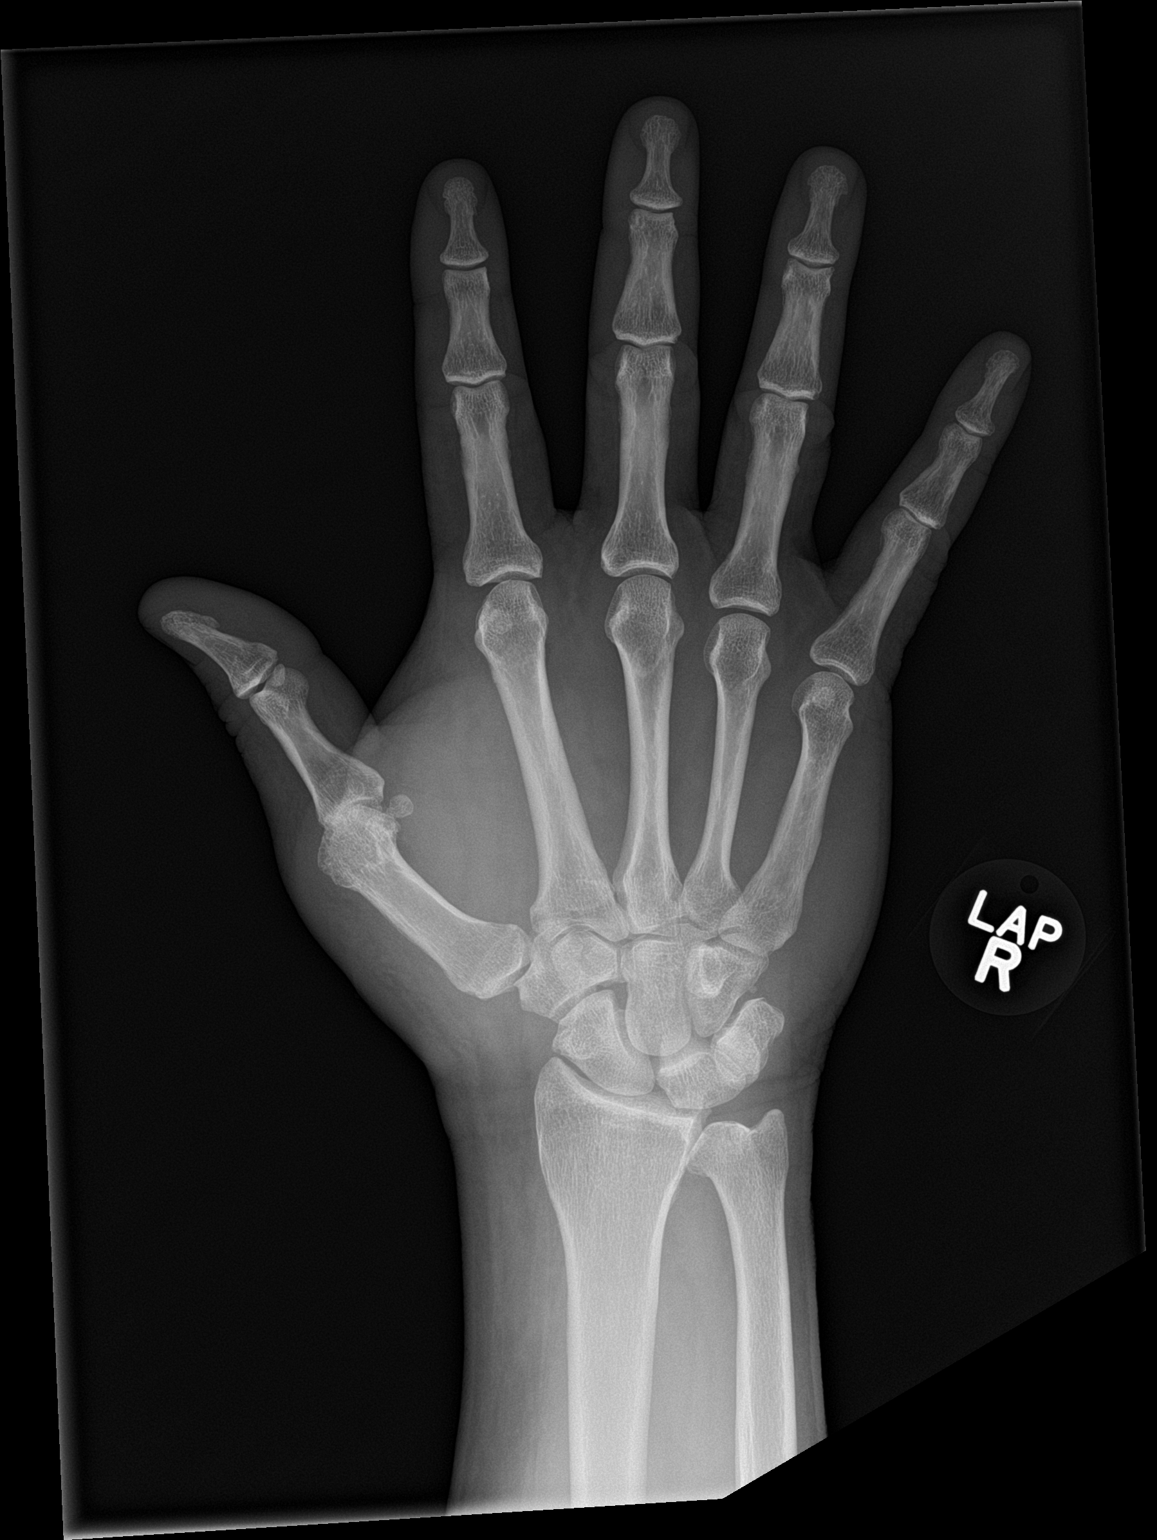
[im 2/3]
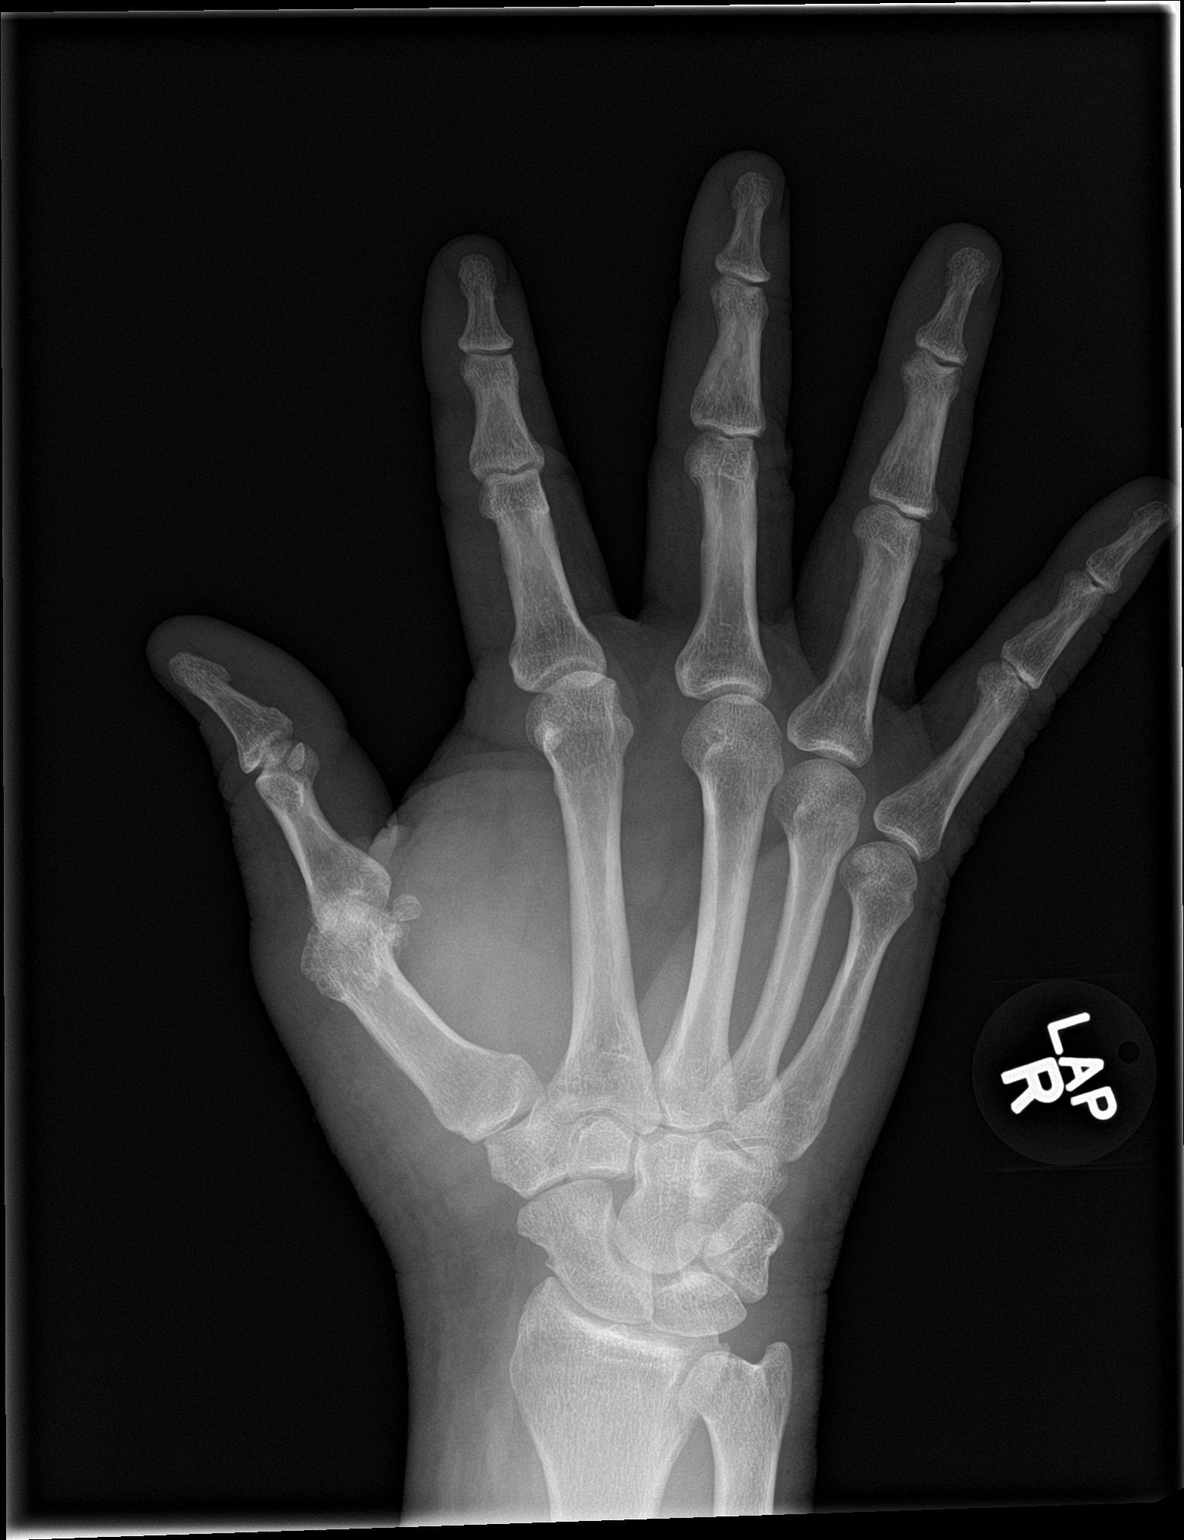
[im 3/3]
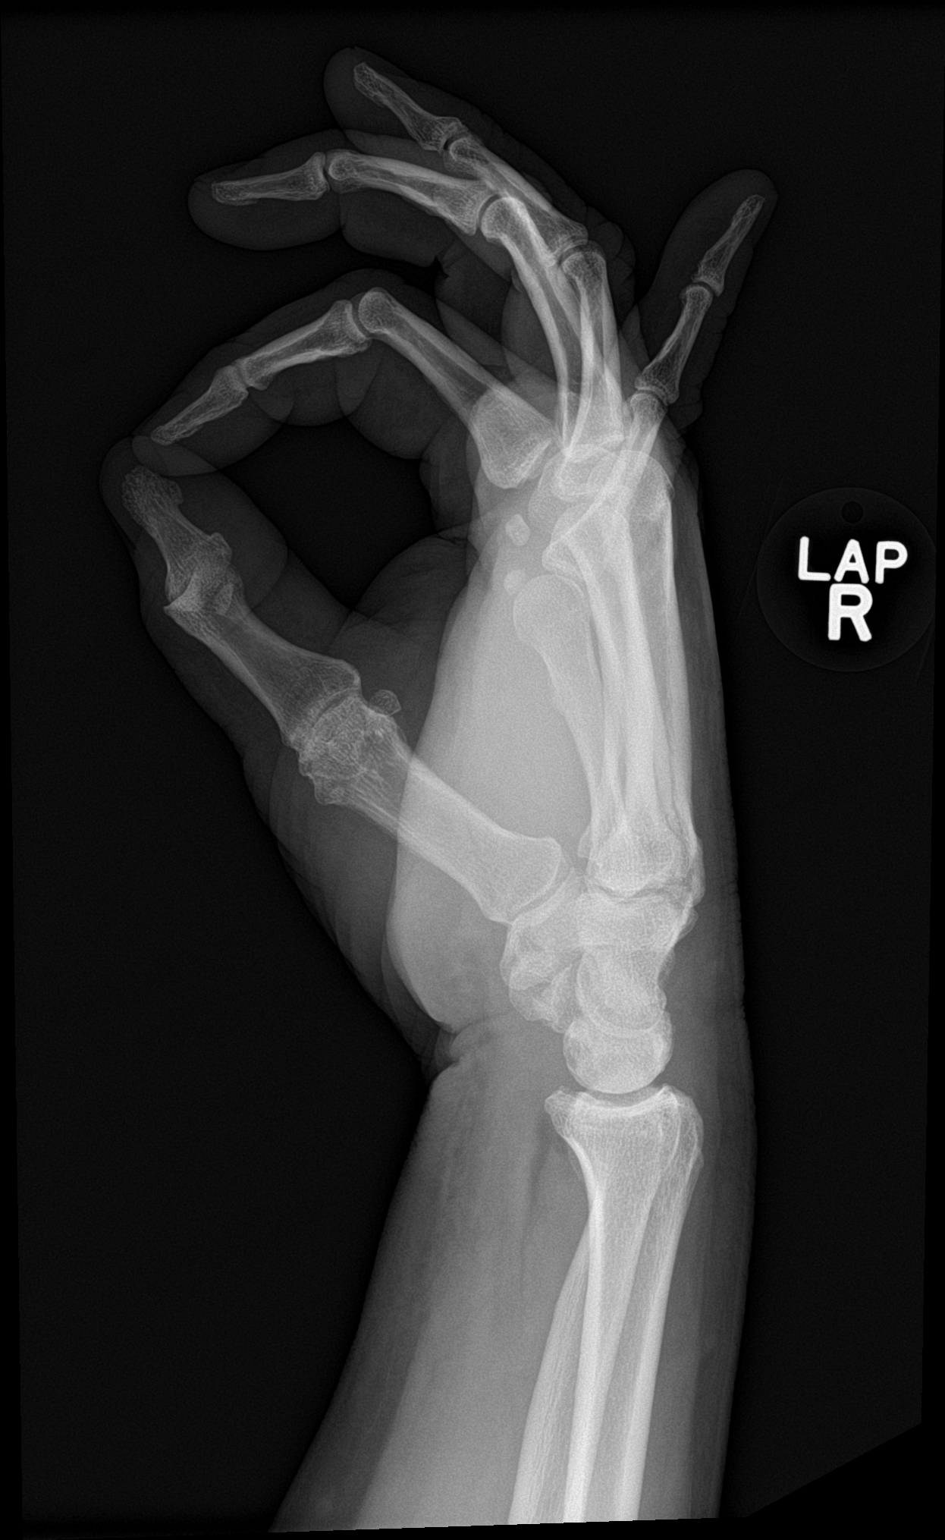

[3 of 3 positions shown; findings below may reference images not displayed]

FINDINGS: No radiopaque foreign body or soft tissue gas collection is
identified. Soft tissues about the hand, particularly on the radial
side, appear swollen. There is no fracture or dislocation.
Degenerative change of the distal radioulnar joint is noted.
IMPRESSION: Soft tissue swelling without underlying acute bony or joint
abnormality.

## 2016-05-14 ENCOUNTER — Encounter
Admission: RE | Admit: 2016-05-14 | Discharge: 2016-05-14 | Disposition: A | Discharge status: Home / Self Care | Payer: BC Managed Care – PPO | Source: Ambulatory Visit | Attending: Orthopedic Surgery | Admitting: Orthopedic Surgery

## 2016-05-14 DIAGNOSIS — Z01812 Encounter for preprocedural laboratory examination: Secondary | ICD-10-CM | POA: Diagnosis present

## 2016-05-14 HISTORY — DX: Depression, unspecified: F32.A

## 2016-05-14 HISTORY — DX: Headache: R51

## 2016-05-14 HISTORY — DX: Anxiety disorder, unspecified: F41.9

## 2016-05-14 HISTORY — DX: Hyperlipidemia, unspecified: E78.5

## 2016-05-14 HISTORY — DX: Headache, unspecified: R51.9

## 2016-05-14 HISTORY — DX: Major depressive disorder, single episode, unspecified: F32.9

## 2016-05-14 LAB — CBC
HCT: 44.2 % (ref 40.0–52.0)
Hemoglobin: 14.6 g/dL (ref 13.0–18.0)
MCH: 28.3 pg (ref 26.0–34.0)
MCHC: 33.1 g/dL (ref 32.0–36.0)
MCV: 85.6 fL (ref 80.0–100.0)
PLATELETS: 221 10*3/uL (ref 150–440)
RBC: 5.16 MIL/uL (ref 4.40–5.90)
RDW: 15.1 % — AB (ref 11.5–14.5)
WBC: 6 10*3/uL (ref 3.8–10.6)

## 2016-05-14 LAB — URINALYSIS, COMPLETE (UACMP) WITH MICROSCOPIC
BILIRUBIN URINE: NEGATIVE
Bacteria, UA: NONE SEEN
HGB URINE DIPSTICK: NEGATIVE
Ketones, ur: 5 mg/dL — AB
LEUKOCYTES UA: NEGATIVE
NITRITE: NEGATIVE
PROTEIN: NEGATIVE mg/dL
Specific Gravity, Urine: 1.027 (ref 1.005–1.030)
WBC UA: NONE SEEN WBC/hpf (ref 0–5)
pH: 5 (ref 5.0–8.0)

## 2016-05-14 LAB — TYPE AND SCREEN
ABO/RH(D): O POS
Antibody Screen: NEGATIVE

## 2016-05-14 LAB — BASIC METABOLIC PANEL
ANION GAP: 11 (ref 5–15)
BUN: 17 mg/dL (ref 6–20)
CALCIUM: 9.6 mg/dL (ref 8.9–10.3)
CO2: 24 mmol/L (ref 22–32)
CREATININE: 1.04 mg/dL (ref 0.61–1.24)
Chloride: 100 mmol/L — ABNORMAL LOW (ref 101–111)
Glucose, Bld: 298 mg/dL — ABNORMAL HIGH (ref 65–99)
Potassium: 4.1 mmol/L (ref 3.5–5.1)
SODIUM: 135 mmol/L (ref 135–145)

## 2016-05-14 LAB — SURGICAL PCR SCREEN
MRSA, PCR: NEGATIVE
STAPHYLOCOCCUS AUREUS: POSITIVE — AB

## 2016-05-14 LAB — PROTIME-INR
INR: 0.81
PROTHROMBIN TIME: 11.1 s — AB (ref 11.4–15.2)

## 2016-05-14 LAB — SEDIMENTATION RATE: Sed Rate: 4 mm/hr (ref 0–20)

## 2016-05-14 LAB — APTT: aPTT: 28 seconds (ref 24–36)

## 2016-05-14 NOTE — Pre-Procedure Instructions (Signed)
Pre-Op Blood Sugar (298) . Dr. Rosey Bath notified and medical clearance requested.

## 2016-05-14 NOTE — Pre-Procedure Instructions (Signed)
Medical clearance request faxed and called to Dr. Rudene Christians office (spoke to [place]).

## 2016-05-14 NOTE — Patient Instructions (Signed)
Your procedure is scheduled on: May 27, 2016 (Tuesday)  Report to Same Day Surgery 2nd floor medical mall [facility] Entrance-take elevator on left to 2nd floor.  Check in with surgery information desk.) To find out your arrival time please call [phone] between 1PM - 3PM on May 26, 2016 (Monday)   Remember: Instructions that are not followed completely may result in serious medical risk, up to and including death, or upon the discretion of your surgeon and anesthesiologist your surgery may need to be rescheduled.    _x___ 1. Do not eat food or drink liquids after midnight. No gum chewing or  hard candies                              __x__ 2. No Alcohol for 24 hours before or after surgery.   __x__3. No Smoking for 24 prior to surgery.   ____  4. Bring all medications with you on the day of surgery if instructed.    __x__ 5. Notify your doctor if there is any change in your medical condition     (cold, fever, infections).     Do not wear jewelry, make-up, hairpins, clips or nail polish.  Do not wear lotions, powders, or perfumes.   Do not shave 48 hours prior to surgery. Men may shave face and neck.  Do not bring valuables to the hospital.    [facility] is not responsible for any belongings or valuables.               Contacts, dentures or bridgework may not be worn into surgery.  Leave your suitcase in the car. After surgery it may be brought to your room.  For patients admitted to the hospital, discharge time is determined by your  treatment team                      Patients discharged the day of surgery will not be allowed to drive home.  You will need someone to drive you home and stay with you the night of your procedure.    Please read over the following fact sheets that you were given:   [facility] Preparing for Surgery and or MRSA Information   _x___ Take anti-hypertensive (unless it includes a diuretic), cardiac, seizure, asthma,     anti-reflux and  psychiatric medicines. These include:  1. ATORVASTATIN  2   DULOXETINE  3.  4.  5.  6.  ____Fleets enema or Magnesium Citrate as directed.   _x___ Use CHG Soap or sage wipes as directed on instruction sheet   ____ Use inhalers on the day of surgery and bring to hospital day of surgery  _x___ Stop Metformin and Janumet 2 days prior to surgery. (STOP METFORMIN ON MAY 13 )   _X___ Take 1/2 of usual insulin dose the night before surgery and none on the morning surgery (TAKE ONE-HALF OF LANTUS AT BEDTIME ON MONDAY NIGHT, MAY 14 )     _x___ Follow recommendations from Cardiologist, Pulmonologist or PCP regarding          stopping Aspirin, Coumadin, Pllavix ,Eliquis, Effient, or Pradaxa, and Pletal.  X____Stop Anti-inflammatories such as Advil, Aleve, Ibuprofen, Motrin, Naproxen, Naprosyn, Goodies powders or aspirin products. OK to take Tylenol  (STOP ALEVE, IBUPROFEN,EXCEDRIN, AND ALKA-SELTZER ONE WEEK PRIOR TO SURGERY)   _x___ Stop supplements until after surgery.  But may continue Vitamin D, Vitamin B,and  multivitamin. (STOP MELATONIN NOW)        ____ Bring C-Pap to the hospital.

## 2016-05-15 LAB — URINE CULTURE: CULTURE: NO GROWTH

## 2016-05-27 ENCOUNTER — Inpatient Hospital Stay: Payer: BC Managed Care – PPO

## 2016-05-27 ENCOUNTER — Inpatient Hospital Stay: Payer: BC Managed Care – PPO | Admitting: Anesthesiology

## 2016-05-27 ENCOUNTER — Inpatient Hospital Stay
Admission: RE | Admit: 2016-05-27 | Discharge: 2016-05-29 | DRG: 470 | Disposition: A | Discharge status: Home / Self Care | Payer: BC Managed Care – PPO | Source: Ambulatory Visit | Attending: Orthopedic Surgery | Admitting: Orthopedic Surgery

## 2016-05-27 ENCOUNTER — Encounter
Admission: RE | Disposition: A | Discharge status: Home / Self Care | Payer: Self-pay | Source: Ambulatory Visit | Attending: Orthopedic Surgery

## 2016-05-27 ENCOUNTER — Encounter: Payer: Self-pay | Admitting: *Deleted

## 2016-05-27 DIAGNOSIS — M25552 Pain in left hip: Secondary | ICD-10-CM | POA: Diagnosis present

## 2016-05-27 DIAGNOSIS — D62 Acute posthemorrhagic anemia: Secondary | ICD-10-CM | POA: Diagnosis not present

## 2016-05-27 DIAGNOSIS — E119 Type 2 diabetes mellitus without complications: Secondary | ICD-10-CM | POA: Diagnosis present

## 2016-05-27 DIAGNOSIS — M1612 Unilateral primary osteoarthritis, left hip: Principal | ICD-10-CM | POA: Diagnosis present

## 2016-05-27 DIAGNOSIS — F329 Major depressive disorder, single episode, unspecified: Secondary | ICD-10-CM | POA: Diagnosis present

## 2016-05-27 DIAGNOSIS — K219 Gastro-esophageal reflux disease without esophagitis: Secondary | ICD-10-CM | POA: Diagnosis present

## 2016-05-27 DIAGNOSIS — Z419 Encounter for procedure for purposes other than remedying health state, unspecified: Secondary | ICD-10-CM

## 2016-05-27 DIAGNOSIS — G8918 Other acute postprocedural pain: Secondary | ICD-10-CM

## 2016-05-27 HISTORY — PX: TOTAL HIP ARTHROPLASTY: SHX124

## 2016-05-27 LAB — CBC
HCT: 40.7 % (ref 40.0–52.0)
HEMOGLOBIN: 13.4 g/dL (ref 13.0–18.0)
MCH: 28.5 pg (ref 26.0–34.0)
MCHC: 32.9 g/dL (ref 32.0–36.0)
MCV: 86.5 fL (ref 80.0–100.0)
PLATELETS: 203 10*3/uL (ref 150–440)
RBC: 4.7 MIL/uL (ref 4.40–5.90)
RDW: 15.6 % — ABNORMAL HIGH (ref 11.5–14.5)
WBC: 10.5 10*3/uL (ref 3.8–10.6)

## 2016-05-27 LAB — GLUCOSE, CAPILLARY
GLUCOSE-CAPILLARY: 172 mg/dL — AB (ref 65–99)
Glucose-Capillary: 149 mg/dL — ABNORMAL HIGH (ref 65–99)
Glucose-Capillary: 177 mg/dL — ABNORMAL HIGH (ref 65–99)

## 2016-05-27 LAB — CREATININE, SERUM
CREATININE: 1.16 mg/dL (ref 0.61–1.24)
GFR calc non Af Amer: >60 mL/min (ref >60)

## 2016-05-27 SURGERY — ARTHROPLASTY, HIP, TOTAL, ANTERIOR APPROACH
Anesthesia: Spinal | Laterality: Left | Wound class: Clean

## 2016-05-27 MED ORDER — TRANEXAMIC ACID 1000 MG/10ML IV SOLN
1000.0000 mg | INTRAVENOUS | Status: DC
  Filled 2016-05-27: qty 10

## 2016-05-27 MED ORDER — MENTHOL 3 MG MT LOZG
1.0000 | LOZENGE | OROMUCOSAL | Status: DC | PRN
  Filled 2016-05-27: qty 9

## 2016-05-27 MED ORDER — LIDOCAINE HCL 2 % EX GEL
CUTANEOUS | Status: AC
  Filled 2016-05-27: qty 5

## 2016-05-27 MED ORDER — TRANEXAMIC ACID 1000 MG/10ML IV SOLN
INTRAVENOUS | Status: DC | PRN
  Administered 2016-05-27: 1000 mg via INTRAVENOUS

## 2016-05-27 MED ORDER — ONDANSETRON HCL 4 MG/2ML IJ SOLN
4.0000 mg | Freq: Four times a day (QID) | INTRAMUSCULAR | Status: DC | PRN
  Administered 2016-05-27 – 2016-05-29 (×2): 4 mg via INTRAVENOUS
  Filled 2016-05-27 (×2): qty 2

## 2016-05-27 MED ORDER — MIDAZOLAM HCL 2 MG/2ML IJ SOLN
INTRAMUSCULAR | Status: AC
  Filled 2016-05-27: qty 2

## 2016-05-27 MED ORDER — CLONAZEPAM 0.5 MG PO TABS
1.0000 mg | ORAL_TABLET | Freq: Every day | ORAL | Status: DC
  Administered 2016-05-27 – 2016-05-28 (×2): 1 mg via ORAL
  Filled 2016-05-27 (×2): qty 2

## 2016-05-27 MED ORDER — SODIUM CHLORIDE 0.9 % IV SOLN
INTRAVENOUS | Status: DC
  Administered 2016-05-27: 13:00:00 via INTRAVENOUS

## 2016-05-27 MED ORDER — VANCOMYCIN HCL IN DEXTROSE 1-5 GM/200ML-% IV SOLN
INTRAVENOUS | Status: AC
  Filled 2016-05-27: qty 200

## 2016-05-27 MED ORDER — CEFAZOLIN SODIUM 10 G IJ SOLR
2.0000 g | Freq: Four times a day (QID) | INTRAMUSCULAR | Status: AC
  Administered 2016-05-27 – 2016-05-28 (×3): 2 g via INTRAVENOUS
  Filled 2016-05-27 (×4): qty 2000

## 2016-05-27 MED ORDER — FAMOTIDINE 20 MG PO TABS
ORAL_TABLET | ORAL | Status: AC
  Filled 2016-05-27: qty 1

## 2016-05-27 MED ORDER — BUPIVACAINE-EPINEPHRINE (PF) 0.25% -1:200000 IJ SOLN
INTRAMUSCULAR | Status: AC
  Filled 2016-05-27: qty 30

## 2016-05-27 MED ORDER — LIDOCAINE HCL 2 % EX GEL
CUTANEOUS | Status: DC | PRN
  Administered 2016-05-27: 1 via TOPICAL

## 2016-05-27 MED ORDER — GLYCOPYRROLATE 0.2 MG/ML IJ SOLN
INTRAMUSCULAR | Status: AC
  Filled 2016-05-27: qty 1

## 2016-05-27 MED ORDER — SILDENAFIL CITRATE 20 MG PO TABS: 60.0000 mg | ORAL_TABLET | Freq: Every day | ORAL | Status: DC | PRN

## 2016-05-27 MED ORDER — ATORVASTATIN CALCIUM 20 MG PO TABS
40.0000 mg | ORAL_TABLET | Freq: Every day | ORAL | Status: DC
  Administered 2016-05-27 – 2016-05-29 (×3): 40 mg via ORAL
  Filled 2016-05-27 (×3): qty 2

## 2016-05-27 MED ORDER — NEOMYCIN-POLYMYXIN B GU 40-200000 IR SOLN
Status: DC | PRN
  Administered 2016-05-27: 4 mL

## 2016-05-27 MED ORDER — BUPIVACAINE-EPINEPHRINE 0.25% -1:200000 IJ SOLN
INTRAMUSCULAR | Status: DC | PRN
  Administered 2016-05-27: 30 mL

## 2016-05-27 MED ORDER — ACETAMINOPHEN 650 MG RE SUPP: 650.0000 mg | Freq: Four times a day (QID) | RECTAL | Status: DC | PRN

## 2016-05-27 MED ORDER — BUPIVACAINE HCL (PF) 0.5 % IJ SOLN
INTRAMUSCULAR | Status: DC | PRN
  Administered 2016-05-27: 3 mL

## 2016-05-27 MED ORDER — KETOROLAC TROMETHAMINE 15 MG/ML IJ SOLN
15.0000 mg | Freq: Four times a day (QID) | INTRAMUSCULAR | Status: AC
Start: 2016-05-28 — End: 2016-05-28
  Administered 2016-05-28 (×4): 15 mg via INTRAVENOUS
  Filled 2016-05-27 (×4): qty 1

## 2016-05-27 MED ORDER — CEFAZOLIN SODIUM-DEXTROSE 2-4 GM/100ML-% IV SOLN: 2.0000 g | Freq: Four times a day (QID) | INTRAVENOUS | Status: DC

## 2016-05-27 MED ORDER — PROPOFOL 500 MG/50ML IV EMUL
INTRAVENOUS | Status: DC | PRN
  Administered 2016-05-27: 100 ug/kg/min via INTRAVENOUS
  Administered 2016-05-27: 75 ug/kg/min via INTRAVENOUS

## 2016-05-27 MED ORDER — MELATONIN 5 MG PO TABS
5.0000 mg | ORAL_TABLET | Freq: Every day | ORAL | Status: DC
  Administered 2016-05-27 – 2016-05-28 (×2): 5 mg via ORAL
  Filled 2016-05-27 (×3): qty 1

## 2016-05-27 MED ORDER — BUPIVACAINE HCL (PF) 0.5 % IJ SOLN
INTRAMUSCULAR | Status: AC
Start: 2016-05-27 — End: 2016-05-27
  Filled 2016-05-27: qty 10

## 2016-05-27 MED ORDER — HYDROCHLOROTHIAZIDE 12.5 MG PO CAPS
12.5000 mg | ORAL_CAPSULE | Freq: Every day | ORAL | Status: DC
  Administered 2016-05-27: 12.5 mg via ORAL
  Filled 2016-05-27: qty 1

## 2016-05-27 MED ORDER — OXYCODONE HCL 5 MG PO TABS
5.0000 mg | ORAL_TABLET | ORAL | Status: DC | PRN
  Administered 2016-05-27 (×2): 10 mg via ORAL
  Administered 2016-05-28 – 2016-05-29 (×4): 5 mg via ORAL
  Filled 2016-05-27: qty 1
  Filled 2016-05-27 (×2): qty 2
  Filled 2016-05-27 (×2): qty 1
  Filled 2016-05-27: qty 2
  Filled 2016-05-27: qty 1

## 2016-05-27 MED ORDER — MORPHINE SULFATE (PF) 2 MG/ML IV SOLN
2.0000 mg | INTRAVENOUS | Status: DC | PRN
  Administered 2016-05-27 – 2016-05-28 (×2): 4 mg via INTRAVENOUS
  Filled 2016-05-27: qty 2
  Filled 2016-05-27 (×2): qty 1

## 2016-05-27 MED ORDER — PROPOFOL 10 MG/ML IV BOLUS
INTRAVENOUS | Status: AC
  Filled 2016-05-27: qty 60

## 2016-05-27 MED ORDER — PROMETHAZINE HCL 25 MG/ML IJ SOLN: 6.2500 mg | INTRAMUSCULAR | Status: DC | PRN

## 2016-05-27 MED ORDER — SODIUM CHLORIDE 0.9 % IV SOLN
INTRAVENOUS | Status: DC
  Administered 2016-05-27 – 2016-05-28 (×3): via INTRAVENOUS

## 2016-05-27 MED ORDER — METOCLOPRAMIDE HCL 10 MG PO TABS: 5.0000 mg | ORAL_TABLET | Freq: Three times a day (TID) | ORAL | Status: DC | PRN

## 2016-05-27 MED ORDER — LISINOPRIL-HYDROCHLOROTHIAZIDE 10-12.5 MG PO TABS: 1.0000 | ORAL_TABLET | Freq: Every day | ORAL | Status: DC

## 2016-05-27 MED ORDER — INSULIN GLARGINE 100 UNIT/ML ~~LOC~~ SOLN
22.0000 [IU] | Freq: Every day | SUBCUTANEOUS | Status: DC
  Administered 2016-05-27 – 2016-05-28 (×2): 22 [IU] via SUBCUTANEOUS
  Filled 2016-05-27 (×4): qty 0.22

## 2016-05-27 MED ORDER — INSULIN ASPART 100 UNIT/ML ~~LOC~~ SOLN
0.0000 [IU] | Freq: Three times a day (TID) | SUBCUTANEOUS | Status: DC
  Administered 2016-05-28: 3 [IU] via SUBCUTANEOUS
  Administered 2016-05-28: 2 [IU] via SUBCUTANEOUS
  Administered 2016-05-28: 3 [IU] via SUBCUTANEOUS
  Administered 2016-05-29: 5 [IU] via SUBCUTANEOUS
  Administered 2016-05-29: 2 [IU] via SUBCUTANEOUS
  Administered 2016-05-29: 3 [IU] via SUBCUTANEOUS
  Filled 2016-05-27 (×2): qty 2
  Filled 2016-05-27 (×2): qty 3
  Filled 2016-05-27: qty 5
  Filled 2016-05-27: qty 2
  Filled 2016-05-27: qty 3

## 2016-05-27 MED ORDER — LISINOPRIL 10 MG PO TABS
10.0000 mg | ORAL_TABLET | Freq: Every day | ORAL | Status: DC
Start: 2016-05-27 — End: 2016-05-29
  Administered 2016-05-27: 10 mg via ORAL
  Filled 2016-05-27: qty 1

## 2016-05-27 MED ORDER — SODIUM & POTASSIUM BICARBONATE PO TBEF: 2.0000 | EFFERVESCENT_TABLET | Freq: Every day | ORAL | Status: DC | PRN

## 2016-05-27 MED ORDER — PROPOFOL 10 MG/ML IV BOLUS
INTRAVENOUS | Status: AC
  Filled 2016-05-27: qty 40

## 2016-05-27 MED ORDER — VANCOMYCIN HCL IN DEXTROSE 1-5 GM/200ML-% IV SOLN
1000.0000 mg | Freq: Once | INTRAVENOUS | Status: AC
  Administered 2016-05-27: 1000 mg via INTRAVENOUS

## 2016-05-27 MED ORDER — PHENOL 1.4 % MT LIQD
1.0000 | OROMUCOSAL | Status: DC | PRN
  Filled 2016-05-27: qty 177

## 2016-05-27 MED ORDER — ACETAMINOPHEN 325 MG PO TABS
650.0000 mg | ORAL_TABLET | Freq: Four times a day (QID) | ORAL | Status: DC | PRN
  Administered 2016-05-28: 650 mg via ORAL
  Filled 2016-05-27: qty 2

## 2016-05-27 MED ORDER — CEFAZOLIN SODIUM-DEXTROSE 2-4 GM/100ML-% IV SOLN
2.0000 g | Freq: Once | INTRAVENOUS | Status: AC
  Administered 2016-05-27: 2 g via INTRAVENOUS

## 2016-05-27 MED ORDER — METOCLOPRAMIDE HCL 5 MG/ML IJ SOLN: 5.0000 mg | Freq: Three times a day (TID) | INTRAMUSCULAR | Status: DC | PRN

## 2016-05-27 MED ORDER — ENOXAPARIN SODIUM 40 MG/0.4ML ~~LOC~~ SOLN
40.0000 mg | SUBCUTANEOUS | Status: DC
  Administered 2016-05-28 – 2016-05-29 (×2): 40 mg via SUBCUTANEOUS
  Filled 2016-05-27 (×2): qty 0.4

## 2016-05-27 MED ORDER — NEOMYCIN-POLYMYXIN B GU 40-200000 IR SOLN
Status: AC
  Filled 2016-05-27: qty 4

## 2016-05-27 MED ORDER — ONDANSETRON HCL 4 MG PO TABS: 4.0000 mg | ORAL_TABLET | Freq: Four times a day (QID) | ORAL | Status: DC | PRN

## 2016-05-27 MED ORDER — FENTANYL CITRATE (PF) 100 MCG/2ML IJ SOLN
INTRAMUSCULAR | Status: AC
  Administered 2016-05-27: 25 ug via INTRAVENOUS
  Filled 2016-05-27: qty 2

## 2016-05-27 MED ORDER — METFORMIN HCL 500 MG PO TABS
1000.0000 mg | ORAL_TABLET | Freq: Two times a day (BID) | ORAL | Status: DC
  Administered 2016-05-27 – 2016-05-29 (×5): 1000 mg via ORAL
  Filled 2016-05-27 (×5): qty 2

## 2016-05-27 MED ORDER — VALACYCLOVIR HCL 500 MG PO TABS
2000.0000 mg | ORAL_TABLET | Freq: Every day | ORAL | Status: DC | PRN
  Filled 2016-05-27: qty 4

## 2016-05-27 MED ORDER — MIDAZOLAM HCL 5 MG/5ML IJ SOLN
INTRAMUSCULAR | Status: DC | PRN
  Administered 2016-05-27 (×2): 1 mg via INTRAVENOUS

## 2016-05-27 MED ORDER — DULOXETINE HCL 60 MG PO CPEP
60.0000 mg | ORAL_CAPSULE | Freq: Every day | ORAL | Status: DC
  Administered 2016-05-27 – 2016-05-29 (×3): 60 mg via ORAL
  Filled 2016-05-27 (×3): qty 1

## 2016-05-27 MED ORDER — KETOROLAC TROMETHAMINE 30 MG/ML IJ SOLN
30.0000 mg | Freq: Once | INTRAMUSCULAR | Status: AC
  Administered 2016-05-27: 30 mg via INTRAVENOUS
  Filled 2016-05-27: qty 1

## 2016-05-27 MED ORDER — FENTANYL CITRATE (PF) 100 MCG/2ML IJ SOLN
25.0000 ug | INTRAMUSCULAR | Status: DC | PRN
  Administered 2016-05-27 (×4): 25 ug via INTRAVENOUS

## 2016-05-27 MED ORDER — MORPHINE SULFATE (PF) 2 MG/ML IV SOLN
2.0000 mg | INTRAVENOUS | Status: DC | PRN
  Administered 2016-05-27 (×2): 2 mg via INTRAVENOUS
  Filled 2016-05-27 (×2): qty 1

## 2016-05-27 MED ORDER — FAMOTIDINE 20 MG PO TABS
20.0000 mg | ORAL_TABLET | Freq: Once | ORAL | Status: AC
  Administered 2016-05-27: 20 mg via ORAL

## 2016-05-27 SURGICAL SUPPLY — 46 items
BLADE SAW SAG 18.5X105 (BLADE) ×3 IMPLANT
BNDG COHESIVE 6X5 TAN STRL LF (GAUZE/BANDAGES/DRESSINGS) ×6 IMPLANT
CANISTER SUCT 1200ML W/VALVE (MISCELLANEOUS) ×3 IMPLANT
CAPT HIP TOTAL 3 ×3 IMPLANT
CATH FOL LEG HOLDER (MISCELLANEOUS) ×3 IMPLANT
CATH TRAY METER 16FR LF (MISCELLANEOUS) ×3 IMPLANT
CHLORAPREP W/TINT 26ML (MISCELLANEOUS) ×3 IMPLANT
DRAPE C-ARM XRAY 36X54 (DRAPES) ×3 IMPLANT
DRAPE INCISE IOBAN 66X60 STRL (DRAPES) ×3 IMPLANT
DRAPE POUCH INSTRU U-SHP 10X18 (DRAPES) ×3 IMPLANT
DRAPE SHEET LG 3/4 BI-LAMINATE (DRAPES) ×9 IMPLANT
DRAPE TABLE BACK 80X90 (DRAPES) ×3 IMPLANT
DRESSING SURGICEL FIBRLLR 1X2 (HEMOSTASIS) ×2 IMPLANT
DRSG OPSITE POSTOP 4X8 (GAUZE/BANDAGES/DRESSINGS) ×3 IMPLANT
DRSG SURGICEL FIBRILLAR 1X2 (HEMOSTASIS) ×6
ELECT BLADE 6.5 EXT (BLADE) ×3 IMPLANT
ELECT REM PT RETURN 9FT ADLT (ELECTROSURGICAL) ×3
ELECTRODE REM PT RTRN 9FT ADLT (ELECTROSURGICAL) ×1 IMPLANT
GLOVE BIOGEL PI IND STRL 9 (GLOVE) ×1 IMPLANT
GLOVE BIOGEL PI INDICATOR 9 (GLOVE) ×2
GLOVE SURG SYN 9.0  PF PI (GLOVE) ×4
GLOVE SURG SYN 9.0 PF PI (GLOVE) ×2 IMPLANT
GOWN SRG 2XL LVL 4 RGLN SLV (GOWNS) ×1 IMPLANT
GOWN STRL NON-REIN 2XL LVL4 (GOWNS) ×2
GOWN STRL REUS W/ TWL LRG LVL3 (GOWN DISPOSABLE) ×1 IMPLANT
GOWN STRL REUS W/TWL LRG LVL3 (GOWN DISPOSABLE) ×2
HEMOVAC 400CC 10FR (MISCELLANEOUS) IMPLANT
HOOD PEEL AWAY FLYTE STAYCOOL (MISCELLANEOUS) ×3 IMPLANT
MAT BLUE FLOOR 46X72 FLO (MISCELLANEOUS) ×3 IMPLANT
NDL SAFETY 18GX1.5 (NEEDLE) ×3 IMPLANT
NEEDLE SPNL 18GX3.5 QUINCKE PK (NEEDLE) ×3 IMPLANT
NS IRRIG 1000ML POUR BTL (IV SOLUTION) ×3 IMPLANT
PACK HIP COMPR (MISCELLANEOUS) ×3 IMPLANT
SOL PREP PVP 2OZ (MISCELLANEOUS) ×3
SOLUTION PREP PVP 2OZ (MISCELLANEOUS) ×1 IMPLANT
STAPLER SKIN PROX 35W (STAPLE) ×3 IMPLANT
STRAP SAFETY BODY (MISCELLANEOUS) ×3 IMPLANT
SUT DVC 2 QUILL PDO  T11 36X36 (SUTURE) ×2
SUT DVC 2 QUILL PDO T11 36X36 (SUTURE) ×1 IMPLANT
SUT SILK 0 (SUTURE) ×2
SUT SILK 0 30XBRD TIE 6 (SUTURE) ×1 IMPLANT
SUT V-LOC 90 ABS DVC 3-0 CL (SUTURE) ×3 IMPLANT
SUT VIC AB 1 CT1 36 (SUTURE) ×3 IMPLANT
SYR 20CC LL (SYRINGE) ×3 IMPLANT
SYR 30ML LL (SYRINGE) ×3 IMPLANT
TOWEL OR 17X26 4PK STRL BLUE (TOWEL DISPOSABLE) ×3 IMPLANT

## 2016-05-27 NOTE — Transfer of Care (Signed)
Immediate Anesthesia Transfer of Care Note  Patient: Travis Ford  Procedure(s) Performed: Procedure(s): TOTAL HIP ARTHROPLASTY ANTERIOR APPROACH (Left)  Patient Location: PACU  Anesthesia Type:Spinal  Level of Consciousness: oriented and drowsy  Airway & Oxygen Therapy: Patient Spontanous Breathing and Patient connected to nasal cannula oxygen  Post-op Assessment: Report given to RN and Post -op Vital signs reviewed and stable  Post vital signs: Reviewed and stable  Last Vitals:  Vitals:   05/27/16 1218 05/27/16 1545  BP: (!) 144/85 139/85  Pulse: 81 74  Resp: 18 (!) 22  Temp: 36.8 C     Last Pain:  Vitals:   05/27/16 1218  TempSrc: Oral  PainSc: 3          Complications: No apparent anesthesia complications

## 2016-05-27 NOTE — H&P (Signed)
Reviewed paper H+P, will be scanned into chart. No changes noted. Patient examined  

## 2016-05-27 NOTE — Progress Notes (Signed)
Reginia Forts RN called from room to start vanc.

## 2016-05-27 NOTE — Anesthesia Post-op Follow-up Note (Cosign Needed)
Anesthesia QCDR form completed.        

## 2016-05-27 NOTE — Progress Notes (Signed)
Called room to ask Reginia Forts RN to notify staff when to start vanc.

## 2016-05-27 NOTE — Anesthesia Preprocedure Evaluation (Signed)
Anesthesia Evaluation  Patient identified by MRN, date of birth, ID band Patient awake    Reviewed: Allergy & Precautions, NPO status , Patient's Chart, lab work & pertinent test results  History of Anesthesia Complications Negative for: history of anesthetic complications  Airway Mallampati: II  TM Distance: >3 FB Neck ROM: full    Dental   Pulmonary neg pulmonary ROS,           Cardiovascular hypertension, Pt. on medications      Neuro/Psych PSYCHIATRIC DISORDERS (Depression) negative neurological ROS     GI/Hepatic Neg liver ROS, GERD  Medicated,  Endo/Other  diabetes, Type 2, Oral Hypoglycemic Agents, Insulin Dependent  Renal/GU negative Renal ROS  negative genitourinary   Musculoskeletal   Abdominal   Peds  Hematology negative hematology ROS (+)   Anesthesia Other Findings Past Medical History: No date: Anxiety No date: Arthritis No date: Cancer ([place])     Comment: melanoma on right arm No date: Depression No date: Diabetes mellitus without complication (HCC) No date: Family history of adverse reaction to anesthes*     Comment: mother gets really sick with anesthesia No date: GERD (gastroesophageal reflux disease)     Comment: uses alka seltzer No date: Headache No date: Hyperlipidemia No date: Hypertension   Reproductive/Obstetrics                             Anesthesia Physical  Anesthesia Plan  ASA: II  Anesthesia Plan: Spinal   Post-op Pain Management:    Induction:   Airway Management Planned:   Additional Equipment:   Intra-op Plan:   Post-operative Plan:   Informed Consent: I have reviewed the patients History and Physical, chart, labs and discussed the procedure including the risks, benefits and alternatives for the proposed anesthesia with the patient or authorized representative who has indicated his/her understanding and acceptance.     Plan  Discussed with:   Anesthesia Plan Comments:         Anesthesia Quick Evaluation

## 2016-05-27 NOTE — Anesthesia Procedure Notes (Signed)
Spinal  Patient location during procedure: OR End time: 05/27/2016 2:07 PM Staffing Anesthesiologist: KARENZ, ANDREW Resident/CRNA: WEATHERLY, JANICE Performed: resident/CRNA  Preanesthetic Checklist Completed: patient identified, site marked, surgical consent, pre-op evaluation, timeout performed, IV checked, risks and benefits discussed and monitors and equipment checked Spinal Block Patient position: sitting Prep: Betadine Patient monitoring: heart rate, continuous pulse ox, blood pressure and cardiac monitor Approach: midline Location: L4-5 Injection technique: single-shot Needle Needle type: Whitacre and Introducer  Needle gauge: 24 G Needle length: 10 cm Assessment Sensory level: T8 Additional Notes Negative paresthesia. Negative blood return. Positive free-flowing CSF. Expiration date of kit checked and confirmed. Patient tolerated procedure well, without complications.       

## 2016-05-27 NOTE — Op Note (Signed)
05/27/2016  3:39 PM  PATIENT:  Travis Ford  61 y.o. male  PRE-OPERATIVE DIAGNOSIS:  primary osteoarthritis of left hip   POST-OPERATIVE DIAGNOSIS:  primary osteoarthritis of left hip   PROCEDURE:  Procedure(s): TOTAL HIP ARTHROPLASTY ANTERIOR APPROACH (Left)  SURGEON: Laurene Footman, MD  ASSISTANTS: None  ANESTHESIA:   spinal  EBL:  Total I/O In: -  Out: 400 [Urine:100; Blood:300]  BLOOD ADMINISTERED:none  DRAINS: none   LOCAL MEDICATIONS USED:  MARCAINE     SPECIMEN:  Source of Specimen:  Left femoral head  DISPOSITION OF SPECIMEN:  PATHOLOGY  COUNTS:  YES  TOURNIQUET:  * No tourniquets in log *  IMPLANTS: Medacta AMIS lateralized 3 stem with S 28 mm head and 56 mm Mpact DM with liner  DICTATION: .Dragon Dictation   The patient was brought to the operating room and after spinal anesthesia was obtained patient was placed on the operative table with the ipsilateral foot into the Medacta attachment, contralateral leg on a well-padded table. C-arm was brought in and preop template x-ray taken. After prepping and draping in usual sterile fashion appropriate patient identification and timeout procedures were completed. Anterior approach to the hip was obtained and centered over the greater trochanter and TFL muscle. The subcutaneous tissue was incised hemostasis being achieved by electrocautery. TFL fascia was incised and the muscle retracted laterally deep retractor placed. The lateral femoral circumflex vessels were identified and ligated. The anterior capsule was exposed and a capsulotomy performed. The neck was identified and a femoral neck cut carried out with a saw. The head was removed without difficulty and showed sclerotic femoral head and acetabulum. Reaming was carried out to 56 mm and a 56 mm cup trial gave appropriate tightness to the acetabular component a 56 DM cup was impacted into position. The leg was then externally rotated and ischiofemoral and pubofemoral  releases carried out. The femur was sequentially broached to a size 3, size 3 lateral with S head trials were placed and the final components chosen. The 3 lateralized stem was inserted along with a S 28 mm head and 56 mm liner. The hip was reduced and was stable the wound was thoroughly irrigated. The deep fascia was closed using a heavy Quill after infiltration of 30 cc of quarter percent Sensorcaine with epinephrine. 3-0 v-loc to close the skin with skin staples Xeroform and honeycomb dressing applied  PLAN OF CARE: Admit to inpatient

## 2016-05-28 ENCOUNTER — Encounter: Payer: Self-pay | Admitting: Orthopedic Surgery

## 2016-05-28 LAB — CBC
HEMATOCRIT: 34.9 % — AB (ref 40.0–52.0)
HEMOGLOBIN: 11.7 g/dL — AB (ref 13.0–18.0)
MCH: 29 pg (ref 26.0–34.0)
MCHC: 33.6 g/dL (ref 32.0–36.0)
MCV: 86.1 fL (ref 80.0–100.0)
Platelets: 206 10*3/uL (ref 150–440)
RBC: 4.05 MIL/uL — ABNORMAL LOW (ref 4.40–5.90)
RDW: 15.2 % — ABNORMAL HIGH (ref 11.5–14.5)
WBC: 6.7 10*3/uL (ref 3.8–10.6)

## 2016-05-28 LAB — GLUCOSE, CAPILLARY
GLUCOSE-CAPILLARY: 140 mg/dL — AB (ref 65–99)
GLUCOSE-CAPILLARY: 155 mg/dL — AB (ref 65–99)
Glucose-Capillary: 166 mg/dL — ABNORMAL HIGH (ref 65–99)
Glucose-Capillary: 133 mg/dL — ABNORMAL HIGH (ref 65–99)

## 2016-05-28 LAB — BASIC METABOLIC PANEL
ANION GAP: 6 (ref 5–15)
BUN: 12 mg/dL (ref 6–20)
CHLORIDE: 98 mmol/L — AB (ref 101–111)
CO2: 28 mmol/L (ref 22–32)
Calcium: 8.3 mg/dL — ABNORMAL LOW (ref 8.9–10.3)
Creatinine, Ser: 1.06 mg/dL (ref 0.61–1.24)
GFR calc non Af Amer: >60 mL/min (ref >60)
Glucose, Bld: 155 mg/dL — ABNORMAL HIGH (ref 65–99)
POTASSIUM: 3.8 mmol/L (ref 3.5–5.1)
Sodium: 132 mmol/L — ABNORMAL LOW (ref 135–145)

## 2016-05-28 MED ORDER — FE FUMARATE-B12-VIT C-FA-IFC PO CAPS
1.0000 | ORAL_CAPSULE | Freq: Two times a day (BID) | ORAL | Status: DC
  Administered 2016-05-28 – 2016-05-29 (×3): 1 via ORAL
  Filled 2016-05-28 (×3): qty 1

## 2016-05-28 MED ORDER — MAGNESIUM HYDROXIDE 400 MG/5ML PO SUSP
30.0000 mL | Freq: Every day | ORAL | Status: DC | PRN
  Administered 2016-05-28: 30 mL via ORAL
  Filled 2016-05-28: qty 30

## 2016-05-28 MED ORDER — SODIUM CHLORIDE 0.9 % IV BOLUS (SEPSIS)
500.0000 mL | Freq: Once | INTRAVENOUS | Status: AC
  Administered 2016-05-28: 500 mL via INTRAVENOUS

## 2016-05-28 MED ORDER — DOCUSATE SODIUM 100 MG PO CAPS
100.0000 mg | ORAL_CAPSULE | Freq: Two times a day (BID) | ORAL | Status: DC
  Administered 2016-05-28 – 2016-05-29 (×3): 100 mg via ORAL
  Filled 2016-05-28 (×3): qty 1

## 2016-05-28 MED ORDER — MAGNESIUM CITRATE PO SOLN
0.5000 | ORAL | Status: DC | PRN
  Administered 2016-05-29: 0.5 via ORAL
  Filled 2016-05-28: qty 296

## 2016-05-28 MED ORDER — BISACODYL 10 MG RE SUPP
10.0000 mg | Freq: Every day | RECTAL | Status: DC | PRN
  Administered 2016-05-29: 10 mg via RECTAL
  Filled 2016-05-28: qty 1

## 2016-05-28 NOTE — Progress Notes (Signed)
Clinical Social Worker (CSW) received SNF consult. PT is recommending home health. RN case manager aware of above. Please reconsult if future social work needs arise. CSW signing off.   Herley Bernardini, LCSW [phone] 

## 2016-05-28 NOTE — Plan of Care (Signed)
Problem: Tissue Perfusion: Goal: Adequacy of tissue perfusion will improve Outcome: Progressing Pt is progressing toward goals.

## 2016-05-28 NOTE — Anesthesia Postprocedure Evaluation (Signed)
Anesthesia Post Note  Patient: Travis Ford  Procedure(s) Performed: Procedure(s) (LRB): TOTAL HIP ARTHROPLASTY ANTERIOR APPROACH (Left)  Patient location during evaluation: Nursing Unit Anesthesia Type: Spinal Pain management: pain level controlled Vital Signs Assessment: post-procedure vital signs reviewed and stable Respiratory status: spontaneous breathing, nonlabored ventilation and respiratory function stable Cardiovascular status: stable Postop Assessment: no headache, no backache, patient able to bend at knees and no signs of nausea or vomiting Anesthetic complications: no     Last Vitals:  Vitals:   05/28/16 0414 05/28/16 0415  BP: (!) 88/55 (!) 88/54  Pulse: 65 63  Resp: 19   Temp: 36.5 C     Last Pain:  Vitals:   05/28/16 0414  TempSrc: Oral  PainSc:                  Ricki Miller

## 2016-05-28 NOTE — Progress Notes (Signed)
Physical Therapy Treatment Patient Details Name: Travis Ford MRN: [id number] DOB: [birth year] Today's Date: 05/28/2016    History of Present Illness Pt is a 61 y.o. male s/p elective L THA anterior approach 05/27/16.  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    PT Comments    Pt's BP 102/56 sitting in chair at beginning of session and after pt ambulated 160 feet with RW pt's BP 106/61 sitting in chair (nursing notified).  Pt's L hip pain initially 2/10 sitting in chair but increased to 10/10 "burning" pain in L hip with ambulation; with vc's for increasing L hip flexion motion (when advancing L LE) pt reporting "burning" pain decreased to 2/10 with rest of ambulation and also end of session sitting in chair.  Will continue to progress pt with strengthening, increasing ambulation distance, and attempt stairs next session as appropriate.    Follow Up Recommendations  Home health PT     Equipment Recommendations  Rolling walker with 5" wheels    Recommendations for Other Services       Precautions / Restrictions Precautions Precautions: Fall;Anterior Hip Restrictions Weight Bearing Restrictions: Yes LLE Weight Bearing: Weight bearing as tolerated    Mobility  Bed Mobility     General bed mobility comments: Deferred d/t pt sitting up in chair beginning and end of session  Transfers Overall transfer level: Needs assistance Equipment used: Rolling walker (2 wheeled) Transfers: Sit to/from Stand Sit to Stand: Min guard         General transfer comment: strong stand with RW; steady  Ambulation/Gait Ambulation/Gait assistance: Min guard (chair follow for safety) Ambulation Distance (Feet): 160 Feet Assistive device: Rolling walker (2 wheeled)   Gait velocity: mildly decreased   General Gait Details: mild decreased stance time L LE; steady; occasional vc's to increase L hip flexion which reduced pt's pain significantly   Stairs            Wheelchair Mobility    Modified [name] (Stroke Patients Only)       Balance Overall balance assessment: Needs assistance Sitting-balance support: No upper extremity supported;Feet supported Sitting balance-Leahy Scale: Normal Sitting balance - Comments: reaching outside BOS   Standing balance support: Bilateral upper extremity supported (on RW) Standing balance-Leahy Scale: Good Standing balance comment: with functional mobility                            Cognition Arousal/Alertness: Awake/alert Behavior During Therapy: WFL for tasks assessed/performed Overall Cognitive Status: Within Functional Limits for tasks assessed                                        Exercises General Exercises - Lower Extremity Long Arc Quad: AROM;Strengthening;Both;10 reps;Seated Hip Flexion/Marching: AROM;Strengthening;Both;10 reps;Seated    General Comments  Pt agreeable to PT session.      Pertinent Vitals/Pain Pain Assessment: 0-10 Pain Score: 2  (initially 10/10 with ambulation but decreased to 2/10 with ambulation) Pain Location: L hip Pain Descriptors / Indicators: Sore;Tender;Tightness Pain Intervention(s): Limited activity within patient's tolerance;Monitored during session;Premedicated before session;Repositioned (pt declined ice pack)    Home Living Family/patient expects to be discharged to:: Private residence Living Arrangements: Spouse/significant other Available Help at Discharge: Family;Available 24 hours/day Type of Home: House Home Access: Stairs to enter Entrance Stairs-Rails: Right Home Layout: One level Home Equipment: Cane - single point;Cane -  quad;Walker - 2 wheels;Bedside commode;Shower seat;Grab bars - toilet;Grab bars - tub/shower;Hand held shower head;Adaptive equipment      Prior Function Level of Independence: Independent      Comments: Indep with ADLs, household and community mobility without assist device; working full-time as Careers information officer at [employer]. + driving; denies fall history.   PT Goals (current goals can now be found in the care plan section) Acute Rehab PT Goals Patient Stated Goal: to go home PT Goal Formulation: With patient Time For Goal Achievement: 06/11/16 Potential to Achieve Goals: Good Progress towards PT goals: Progressing toward goals    Frequency    BID      PT Plan Current plan remains appropriate    Co-evaluation              AM-PAC PT "6 Clicks" Daily Activity  Outcome Measure  Difficulty turning over in bed (including adjusting bedclothes, sheets and blankets)?: A Little Difficulty moving from lying on back to sitting on the side of the bed? : A Little Difficulty sitting down on and standing up from a chair with arms (e.g., wheelchair, bedside commode, etc,.)?: A Little Help needed moving to and from a bed to chair (including a wheelchair)?: A Little Help needed walking in hospital room?: A Little Help needed climbing 3-5 steps with a railing? : A Little 6 Click Score: 18    End of Session Equipment Utilized During Treatment: Gait belt Activity Tolerance: Other (comment) (Pt tolerated treatment fairly well) Patient left: in chair;with call bell/phone within reach;with chair alarm set;with family/visitor present;with SCD's reapplied (B heels elevated via pillows) Nurse Communication: Mobility status;Precautions (Pt's BP during session) PT Visit Diagnosis: Other abnormalities of gait and mobility (R26.89);Pain Pain - Right/Left: Left Pain - part of body: Hip     Time: 1303-1330 PT Time Calculation (min) (ACUTE ONLY): 27 min  Charges:  $Gait Training: 8-22 mins $Therapeutic Activity: 8-22 mins                    G CodesLeitha Bleak, PT 05/28/16, 2:20 PM [phone]

## 2016-05-28 NOTE — Care Management Note (Signed)
Case Management Note  Patient Details  Name: Travis Ford MRN: [id number] Date of Birth: 08-17-1955  Subjective/Objective:  POD # 1 left THA. Met with patient  at bedside to discuss discharge planning. Offered choice of home health agencies. He prefers.the one he used in Feb with his right THA.   Referral to [facility] for HHPT. PCP: Governor Specking. Pharmacy: [facility] [phone]. Called Lovenox 40 mg # 14, no refills. Patient has a walker. No DME needs.                                Action/Plan: [facility] for HHPT, Lovenox called in, No DME needs.   Expected Discharge Date:                  Expected Discharge Plan:  [place]  In-House Referral:     Discharge planning Services  CM Consult  Post Acute Care Choice:  Home Health Choice offered to:  Patient  DME Arranged:    DME Agency:     HH Arranged:  PT [place]:  [facility] (now [facility])  Status of Service:  In process, will continue to follow  If discussed at Long Length of Stay Meetings, dates discussed:    Additional Comments:  Jolly Mango, RN 05/28/2016, 2:22 PM

## 2016-05-28 NOTE — Progress Notes (Signed)
Shift assessment completed. Pt is resting in bed, alert and oriented, in no distress. Pt is on room air, lungs clear bilat, Hr is regular, abdomen is soft,bs heard. Pt has honeycomb dressing intact to L hip area, no bruising noted, some soft edema present. Pt has teds and foot pumps on bilat.piv #20 intact to rac with iv nsi nfusing per md order. Pt stating that pain is much better this morning than it was last night. Pt received tylenol for pain, bp is 90/50, this writer explained that further pain medication cannot be given until bp improves, pt stated he understood. Since assessment, pt has had am blood pressure meds held, and has worked successfully with physical therapy, is oob to chair. Call bell in reach.

## 2016-05-28 NOTE — Progress Notes (Signed)
   Subjective: 1 Day Post-Op Procedure(s) (LRB): TOTAL HIP ARTHROPLASTY ANTERIOR APPROACH (Left) Patient reports pain as mild.  Pain severe last night, improved this morning Patient is well, and has had no acute complaints or problems Denies any dizziness, lightheadedness, CP, SOB, ABD pain. We will continue therapy today.  Plan is to go Home after hospital stay.  Objective: Vital signs in last 24 hours: Temp:  [97.3 F (36.3 C)-99.2 F (37.3 C)] 99.2 F (37.3 C) (05/16 0729) Pulse Rate:  [55-81] 66 (05/16 0729) Resp:  [10-23] 17 (05/16 0729) BP: (88-144)/(50-91) 90/50 (05/16 0729) SpO2:  [94 %-100 %] 94 % (05/16 0729) Weight:  [121.1 kg (267 lb)] 121.1 kg (267 lb) (05/15 1218)  Intake/Output from previous day: 05/15 0701 - 05/16 0700 In: 2176.7 [I.V.:1976.7; IV Piggyback:200] Out: 950 [Urine:650; Blood:300] Intake/Output this shift: No intake/output data recorded.   Recent Labs  05/27/16 1730 05/28/16 0403  HGB 13.4 11.7*    Recent Labs  05/27/16 1730 05/28/16 0403  WBC 10.5 6.7  RBC 4.70 4.05*  HCT 40.7 34.9*  PLT 203 206    Recent Labs  05/27/16 1730 05/28/16 0403  NA  --  132*  K  --  3.8  CL  --  98*  CO2  --  28  BUN  --  12  CREATININE 1.16 1.06  GLUCOSE  --  155*  CALCIUM  --  8.3*   No results for input(s): LABPT, INR in the last 72 hours.  EXAM General - Patient is Alert, Appropriate and Oriented Extremity - Neurovascular intact Sensation intact distally Intact pulses distally Dorsiflexion/Plantar flexion intact No cellulitis present Compartment soft Dressing - dressing C/D/I and no drainage Motor Function - intact, moving foot and toes well on exam.  Past Medical History:  Diagnosis Date  . Anxiety   . Arthritis   . Cancer ([place])    melanoma on right arm  . Depression   . Diabetes mellitus without complication ([place])   . Family history of adverse reaction to anesthesia    mother gets really sick with anesthesia  . GERD  (gastroesophageal reflux disease)    uses alka seltzer  . Headache   . Hyperlipidemia   . Hypertension     Assessment/Plan:   1 Day Post-Op Procedure(s) (LRB): TOTAL HIP ARTHROPLASTY ANTERIOR APPROACH (Left) Active Problems:   Primary localized osteoarthritis of left hip  Acute post op blood loss anemia   Estimated body mass index is 36.21 kg/m as calculated from the following:   Height as of this encounter: 6' (1.829 m).   Weight as of this encounter: 121.1 kg (267 lb). Advance diet Up with therapy  Acute post op blood loss anemia - start Iron, recheck labs in the am Needs BM CM to assist with discharge   DVT Prophylaxis - Lovenox, Foot Pumps and TED hose Weight-Bearing as tolerated to right leg   T. Rachelle Hora, PA-C [place] 05/28/2016, 7:48 AM

## 2016-05-28 NOTE — Progress Notes (Signed)
Pt bp increased to 106/61 after physical therapy. Ns bolus hung per md order, dr. Rudene Christians has rounded on pt.

## 2016-05-28 NOTE — Evaluation (Signed)
Physical Therapy Evaluation Patient Details Name: Travis Ford MRN: [id number] DOB: 1955-10-09 Today's Date: 05/28/2016   History of Present Illness  Pt is a 61 y.o. male s/p elective L THA anterior approach 05/27/16.  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  Clinical Impression  Prior to hospital admission, pt was independent and working full time.  Pt lives with his wife in 1 level home with 4 steps to enter with R railing.  Currently pt is SBA supine to sit and CGA with transfers and walking a few feet bed to chair with RW.  Pt's BP initially 88/58 laying in bed; increased to 106/65 sitting edge of bed; and decreased to 95/59 sitting in chair end of session (nursing notified).  Pt would benefit from skilled PT to address noted impairments and functional limitations (see below for any additional details).  Upon hospital discharge, recommend pt discharge to home with support of family.    Follow Up Recommendations Home health PT    Equipment Recommendations  Rolling walker with 5" wheels    Recommendations for Other Services       Precautions / Restrictions Precautions Precautions: Fall;Anterior Hip Restrictions Weight Bearing Restrictions: Yes LLE Weight Bearing: Weight bearing as tolerated      Mobility  Bed Mobility Overal bed mobility: Needs Assistance Bed Mobility: Supine to Sit     Supine to sit: Supervision;HOB elevated     General bed mobility comments: mild increased effort and time to perform on own  Transfers Overall transfer level: Needs assistance Equipment used: Rolling walker (2 wheeled) Transfers: Sit to/from Stand Sit to Stand: Min guard         General transfer comment: strong stand with RW; steady  Ambulation/Gait Ambulation/Gait assistance: Min guard Ambulation Distance (Feet): 3 Feet (bed to recliner) Assistive device: Rolling walker (2 wheeled)   Gait velocity: mildly decreased   General Gait Details: mild decreased stance time L LE;  steady  Financial trader [name] (Stroke Patients Only)       Balance Overall balance assessment: Needs assistance Sitting-balance support: No upper extremity supported;Feet supported Sitting balance-Leahy Scale: Good Sitting balance - Comments: reaching within BOS   Standing balance support: Bilateral upper extremity supported (on RW) Standing balance-Leahy Scale: Good Standing balance comment: with functional mobility                             Pertinent Vitals/Pain Pain Assessment: 0-10 Pain Score: 4  Pain Location: L hip Pain Descriptors / Indicators: Sore;Tender Pain Intervention(s): Limited activity within patient's tolerance;Monitored during session;Premedicated before session;Repositioned (pt declined ice packs end of session but placed within pt's reach)  HR and O2 WFL during session.    Home Living Family/patient expects to be discharged to:: Private residence Living Arrangements: Spouse/significant other Available Help at Discharge: Family;Available 24 hours/day Type of Home: House Home Access: Stairs to enter Entrance Stairs-Rails: Right Entrance Stairs-Number of Steps: 4 STE Home Layout: One level Home Equipment: Cane - single point;Cane - quad;Walker - 2 wheels;Bedside commode;Shower seat;Grab bars - toilet;Grab bars - tub/shower;Hand held shower head;Adaptive equipment      Prior Function Level of Independence: Independent         Comments: Indep with ADLs, household and community mobility without assist device; working full-time as Careers information officer at [employer]. + driving; denies fall history.  Hand Dominance        Extremity/Trunk Assessment   Upper Extremity Assessment Upper Extremity Assessment: Overall WFL for tasks assessed    Lower Extremity Assessment Lower Extremity Assessment: RLE deficits/detail;LLE deficits/detail RLE Deficits / Details: R LE strength and ROM WFL LLE Deficits  / Details: L hip flexion at least 3-/5 (limited d/t pain); L knee flexion/extension and DF at least 4/5       Communication   Communication: No difficulties  Cognition Arousal/Alertness: Awake/alert Behavior During Therapy: WFL for tasks assessed/performed Overall Cognitive Status: Within Functional Limits for tasks assessed                                        General Comments   Nursing cleared pt for participation in physical therapy.  Pt agreeable to PT session.    Exercises Total Joint Exercises Ankle Circles/Pumps: AROM;Strengthening;Both;10 reps;Supine Quad Sets: AROM;Strengthening;Both;10 reps;Supine Towel Squeeze: AROM;Strengthening;Both;10 reps;Supine (pillow between pt's knees) Short Arc Quad: AROM;Strengthening;Left;10 reps;Supine Hip ABduction/ADduction: AAROM;Strengthening;Left;10 reps;Supine Straight Leg Raises: AAROM;Strengthening;Left;10 reps;Supine  Vc's required for above exercise technique.   Assessment/Plan    PT Assessment Patient needs continued PT services  PT Problem List Decreased mobility;Pain;Decreased strength;Decreased activity tolerance       PT Treatment Interventions DME instruction;Gait training;Stair training;Functional mobility training;Therapeutic activities;Therapeutic exercise;Balance training;Patient/family education    PT Goals (Current goals can be found in the Care Plan section)  Acute Rehab PT Goals Patient Stated Goal: to go home PT Goal Formulation: With patient Time For Goal Achievement: 06/11/16 Potential to Achieve Goals: Good    Frequency BID   Barriers to discharge        Co-evaluation               AM-PAC PT "6 Clicks" Daily Activity  Outcome Measure Difficulty turning over in bed (including adjusting bedclothes, sheets and blankets)?: A Little Difficulty moving from lying on back to sitting on the side of the bed? : A Little Difficulty sitting down on and standing up from a chair with arms  (e.g., wheelchair, bedside commode, etc,.)?: Total Help needed moving to and from a bed to chair (including a wheelchair)?: A Little Help needed walking in hospital room?: A Little Help needed climbing 3-5 steps with a railing? : A Little 6 Click Score: 16    End of Session Equipment Utilized During Treatment: Gait belt Activity Tolerance: Patient tolerated treatment well Patient left: in chair;with call bell/phone within reach;with chair alarm set;with family/visitor present;with SCD's reapplied (B heels elevated via pillow) Nurse Communication: Mobility status;Precautions (Pt's BP during session) PT Visit Diagnosis: Other abnormalities of gait and mobility (R26.89);Pain Pain - Right/Left: Left Pain - part of body: Hip    Time: 0626-9485 PT Time Calculation (min) (ACUTE ONLY): 35 min   Charges:   PT Evaluation $PT Eval Low Complexity: 1 Procedure PT Treatments $Therapeutic Exercise: 8-22 mins   PT G CodesLeitha Bleak, PT 05/28/16, 10:58 AM [phone]

## 2016-05-29 LAB — GLUCOSE, CAPILLARY
GLUCOSE-CAPILLARY: 245 mg/dL — AB (ref 65–99)
GLUCOSE-CAPILLARY: 171 mg/dL — AB (ref 65–99)
GLUCOSE-CAPILLARY: 128 mg/dL — AB (ref 65–99)

## 2016-05-29 LAB — SURGICAL PATHOLOGY

## 2016-05-29 MED ORDER — OXYCODONE HCL 5 MG PO TABS: 5.0000 mg | ORAL_TABLET | ORAL | 0 refills | Status: AC | PRN

## 2016-05-29 MED ORDER — ENOXAPARIN SODIUM 40 MG/0.4ML ~~LOC~~ SOLN: 40.0000 mg | SUBCUTANEOUS | 0 refills | Status: AC

## 2016-05-29 MED ORDER — FLEET ENEMA 7-19 GM/118ML RE ENEM
1.0000 | ENEMA | Freq: Once | RECTAL | Status: AC
  Administered 2016-05-29: 1 via RECTAL

## 2016-05-29 NOTE — Progress Notes (Signed)
PT Cancellation Note  Patient Details Name: Travis Ford MRN: [id number] DOB: 04-12-1955   Cancelled Treatment:    Reason Eval/Treat Not Completed: Patient declined, no reason specified.  Pt laying in bed upon PT entry.  Pt reporting not feeling good (still trying to have bowel movement) and feeling "wiped out".  Pt encouraged to go for a walk (and educated on benefits of walking) but pt declined; pt also declined ex's in bed.  Nursing notified of above and also that pt declined PT on 3 separate attempts this afternoon.  Will re-attempt PT tomorrow if pt does not discharge home today.   Leitha Bleak, PT 05/29/16, 4:18 PM [phone]

## 2016-05-29 NOTE — Progress Notes (Signed)
DISCHARGE NOTE:  Pt given discharge instructions and prescriptions (oxycodone, Lovenox). Lovenox kit and extra dressing given to pt. Pt verbalized understanding.  Pt wheeled out of staff.

## 2016-05-29 NOTE — Progress Notes (Signed)
   Subjective: 2 Days Post-Op Procedure(s) (LRB): TOTAL HIP ARTHROPLASTY ANTERIOR APPROACH (Left) Patient reports pain as mild.  Pain improving.  Patient is well, and has had no acute complaints or problems Denies any dizziness, lightheadedness, CP, SOB, ABD pain. We will continue therapy today.  Plan is to go Home after hospital stay.  Objective: Vital signs in last 24 hours: Temp:  [97.7 F (36.5 C)-99.1 F (37.3 C)] 98.7 F (37.1 C) (05/17 0731) Pulse Rate:  [70-80] 80 (05/17 0731) Resp:  [18-19] 18 (05/17 0731) BP: (98-117)/(49-66) 106/66 (05/17 0731) SpO2:  [94 %-97 %] 94 % (05/17 0731)  Intake/Output from previous day: 05/16 0701 - 05/17 0700 In: 3046.7 [P.O.:480; I.V.:2566.7] Out: 575 [Urine:575] Intake/Output this shift: No intake/output data recorded.   Recent Labs  05/27/16 1730 05/28/16 0403  HGB 13.4 11.7*    Recent Labs  05/27/16 1730 05/28/16 0403  WBC 10.5 6.7  RBC 4.70 4.05*  HCT 40.7 34.9*  PLT 203 206    Recent Labs  05/27/16 1730 05/28/16 0403  NA  --  132*  K  --  3.8  CL  --  98*  CO2  --  28  BUN  --  12  CREATININE 1.16 1.06  GLUCOSE  --  155*  CALCIUM  --  8.3*   No results for input(s): LABPT, INR in the last 72 hours.  EXAM General - Patient is Alert, Appropriate and Oriented Extremity - Neurovascular intact Sensation intact distally Intact pulses distally Dorsiflexion/Plantar flexion intact No cellulitis present Compartment soft Dressing - dressing C/D/I and no drainage Motor Function - intact, moving foot and toes well on exam.  Past Medical History:  Diagnosis Date  . Anxiety   . Arthritis   . Cancer ([place])    melanoma on right arm  . Depression   . Diabetes mellitus without complication ([place])   . Family history of adverse reaction to anesthesia    mother gets really sick with anesthesia  . GERD (gastroesophageal reflux disease)    uses alka seltzer  . Headache   . Hyperlipidemia   . Hypertension      Assessment/Plan:   2 Days Post-Op Procedure(s) (LRB): TOTAL HIP ARTHROPLASTY ANTERIOR APPROACH (Left) Active Problems:   Primary localized osteoarthritis of left hip  Acute post op blood loss anemia   Estimated body mass index is 36.21 kg/m as calculated from the following:   Height as of this encounter: 6' (1.829 m).   Weight as of this encounter: 121.1 kg (267 lb). Advance diet Up with therapy  Acute post op blood loss anemia - continue with Iron Needs BM CM to assist with discharge, home with HHPT today pending BM, completion of PT goals.   DVT Prophylaxis - Lovenox, Foot Pumps and TED hose Weight-Bearing as tolerated to right leg   T. Rachelle Hora, PA-C [place] 05/29/2016, 7:48 AM

## 2016-05-29 NOTE — Progress Notes (Signed)
PT Cancellation Note  Patient Details Name: Travis Ford MRN: [id number] DOB: Jan 29, 1955   Cancelled Treatment:    Reason Eval/Treat Not Completed: Patient declined, no reason specified   Pt offered and encouraged to participate on 2 attempts this pm.  Pt declined both session.  Stated he needed to have a BM and felt "drained" of energy.  Encouraged mobility skills to assist with bowel mobility but he continued to decline.  Discussed bed mobility as it was not formally addressed in am session and pt stated he returned to bed with nursing and did well and was not concerned about being able to manage at home.   Chesley Noon 05/29/2016, 2:22 PM

## 2016-05-29 NOTE — Discharge Instructions (Signed)

## 2016-05-29 NOTE — Progress Notes (Signed)
Pt given a suppository and magnesium citrate. No bowel movement. Rachelle Hora notified, orders received for fleet enema. Per Gerald Stabs if pt does not have bowel movement, pt will stay until tomorrow.

## 2016-05-29 NOTE — Discharge Summary (Signed)
Physician Discharge Summary  Patient ID: Travis Ford MRN: [id number] DOB/AGE: 05/26/1955 61 y.o.  Admit date: 05/27/2016 Discharge date: 05/29/2016  Admission Diagnoses:  primary osteoarthritis of left hip    Discharge Diagnoses: Patient Active Problem List   Diagnosis Date Noted  . Primary localized osteoarthritis of left hip 05/27/2016  . Primary localized osteoarthritis of right hip 02/19/2016    Past Medical History:  Diagnosis Date  . Anxiety   . Arthritis   . Cancer ([place])    melanoma on right arm  . Depression   . Diabetes mellitus without complication ([place])   . Family history of adverse reaction to anesthesia    mother gets really sick with anesthesia  . GERD (gastroesophageal reflux disease)    uses alka seltzer  . Headache   . Hyperlipidemia   . Hypertension      Transfusion: none   Consultants (if any):   Discharged Condition: Improved  Hospital Course: Travis Ford is an 61 y.o. male who was admitted 05/27/2016 with a diagnosis of left hip osteoarthritis and went to the operating room on 05/27/2016 and underwent the above named procedures.    Surgeries: Procedure(s): TOTAL HIP ARTHROPLASTY ANTERIOR APPROACH on 05/27/2016 Patient tolerated the surgery well. Taken to PACU where she was stabilized and then transferred to the orthopedic floor.  Started on Lovenox 40 q 24 hrs. Foot pumps applied bilaterally at 80 mm. Heels elevated on bed with rolled towels. No evidence of DVT. Negative [place]. Physical therapy started on day #1 for gait training and transfer. OT started day #1 for ADL and assisted devices.  Patient's foley was d/c on day #1. Patient's IV was d/c on day #2.  On post op day #2 patient was stable and ready for discharge to home with HHPT.  Implants:  Medacta AMIS lateralized 3 stem with S 28 mm head and 56 mm Mpact DM with liner   He was given perioperative antibiotics:  Anti-infectives    Start     Dose/Rate Route Frequency Ordered Stop    05/27/16 2000  ceFAZolin (ANCEF) 2 g in dextrose 5 % 100 mL IVPB     2 g 200 mL/hr over 30 Minutes Intravenous Every 6 hours 05/27/16 1715 05/28/16 0804   05/27/16 1715  ceFAZolin (ANCEF) IVPB 2g/100 mL premix  Status:  Discontinued     2 g 200 mL/hr over 30 Minutes Intravenous Every 6 hours 05/27/16 1704 05/27/16 1714   05/27/16 1704  valACYclovir (VALTREX) tablet 2,000 mg    Comments:  Takes 2 tablets at onset and 2 tablets the next day     2,000 mg Oral Daily PRN 05/27/16 1704     05/27/16 1204  vancomycin (VANCOCIN) 1-5 GM/200ML-% IVPB    Comments:  Slemenda, Debbie: cabinet override      05/27/16 1204 05/28/16 0014   05/27/16 0245  ceFAZolin (ANCEF) IVPB 2g/100 mL premix     2 g 200 mL/hr over 30 Minutes Intravenous  Once 05/27/16 0238 05/27/16 1446   05/27/16 0245  vancomycin (VANCOCIN) IVPB 1000 mg/200 mL premix     1,000 mg 200 mL/hr over 60 Minutes Intravenous  Once 05/27/16 0238 05/27/16 1410    .  He was given sequential compression devices, early ambulation, and Lovenox for DVT prophylaxis.  He benefited maximally from the hospital stay and there were no complications.    Recent vital signs:  Vitals:   05/28/16 2300 05/29/16 0731  BP: 117/65 106/66  Pulse: 70 80  Resp: 18 18  Temp: 98.7 F (37.1 C) 98.7 F (37.1 C)    Recent laboratory studies:  Lab Results  Component Value Date   HGB 11.7 (L) 05/28/2016   HGB 13.4 05/27/2016   HGB 14.6 05/14/2016   Lab Results  Component Value Date   WBC 6.7 05/28/2016   PLT 206 05/28/2016   Lab Results  Component Value Date   INR 0.81 05/14/2016   Lab Results  Component Value Date   NA 132 (L) 05/28/2016   K 3.8 05/28/2016   CL 98 (L) 05/28/2016   CO2 28 05/28/2016   BUN 12 05/28/2016   CREATININE 1.06 05/28/2016   GLUCOSE 155 (H) 05/28/2016    Discharge Medications:   Allergies as of 05/29/2016   No Known Allergies     Medication List    STOP taking these medications   naproxen sodium 220 MG  tablet Commonly known as:  ANAPROX   traMADol 50 MG tablet Commonly known as:  ULTRAM     TAKE these medications   atorvastatin 40 MG tablet Commonly known as:  LIPITOR Take 40 mg by mouth daily.   clonazePAM 1 MG tablet Commonly known as:  KLONOPIN Take 1 mg by mouth at bedtime.   docusate sodium 100 MG capsule Commonly known as:  COLACE Take 1 capsule (100 mg total) by mouth 2 (two) times daily.   DULoxetine 60 MG capsule Commonly known as:  CYMBALTA Take 60 mg by mouth daily.   enoxaparin 40 MG/0.4ML injection Commonly known as:  LOVENOX Inject 0.4 mLs (40 mg total) into the skin daily. What changed:  Another medication with the same name was added. Make sure you understand how and when to take each.   enoxaparin 40 MG/0.4ML injection Commonly known as:  LOVENOX Inject 0.4 mLs (40 mg total) into the skin daily. What changed:  You were already taking a medication with the same name, and this prescription was added. Make sure you understand how and when to take each.   HYDROcodone-acetaminophen 5-325 MG tablet Commonly known as:  NORCO/VICODIN Take 1-2 tablets by mouth every 6 (six) hours as needed for moderate pain (depends on pain level if takes 1-2  tablets).   ibuprofen 200 MG tablet Commonly known as:  ADVIL,MOTRIN Take 400 mg by mouth daily as needed for moderate pain.   LANTUS SOLOSTAR 100 UNIT/ML Solostar Pen Generic drug:  Insulin Glargine Inject 22 Units into the skin at bedtime.   lisinopril-hydrochlorothiazide 10-12.5 MG tablet Commonly known as:  PRINZIDE,ZESTORETIC Take 1 tablet by mouth daily.   Melatonin 5 MG Caps Take 5 mg by mouth at bedtime.   metFORMIN 1000 MG tablet Commonly known as:  GLUCOPHAGE Take 1,000 mg by mouth 2 (two) times daily.   oxyCODONE 5 MG immediate release tablet Commonly known as:  Oxy IR/ROXICODONE Take 1-2 tablets (5-10 mg total) by mouth every 3 (three) hours as needed for breakthrough pain. What changed:  Another  medication with the same name was added. Make sure you understand how and when to take each.   oxyCODONE 5 MG immediate release tablet Commonly known as:  Oxy IR/ROXICODONE Take 1-2 tablets (5-10 mg total) by mouth every 3 (three) hours as needed for breakthrough pain. What changed:  You were already taking a medication with the same name, and this prescription was added. Make sure you understand how and when to take each.   sildenafil 20 MG tablet Commonly known as:  REVATIO Take 60-100 mg by  mouth daily as needed (ED).   sodium-potassium bicarbonate Tbef dissolvable tablet Commonly known as:  ALKA-SELTZER GOLD Take 2 tablets by mouth daily as needed (indigestion).   valACYclovir 1000 MG tablet Commonly known as:  VALTREX Take 2,000 mg by mouth daily as needed (outbreaks). Takes 2 tablets at onset and 2 tablets the next day       Diagnostic Studies: Dg Hip Operative Unilat With Pelvis Left  Result Date: 05/27/2016 CLINICAL DATA:  Hip replacement EXAM: OPERATIVE LEFT HIP (WITH PELVIS IF PERFORMED) 1 VIEWS TECHNIQUE: Fluoroscopic spot image(s) were submitted for interpretation post-operatively. Study is labeled as left-sided. COMPARISON:  None of the ipsilateral hip. FINDINGS: Two fluoroscopic images show sizing and placement of a total hip arthroplasty. Incomplete visual coverage of the prosthesis, no visualized fracture. IMPRESSION: Fluoroscopy for total left hip arthroplasty.  No unexpected finding. Electronically Signed   By: Monte Fantasia M.D.   On: 05/27/2016 15:49   Dg Hip Unilat W Or W/o Pelvis 2-3 Views Left  Result Date: 05/27/2016 CLINICAL DATA:  Postoperative evaluation status post left hip arthroplasty. EXAM: DG HIP (WITH OR WITHOUT PELVIS) 2-3V LEFT COMPARISON:  None. FINDINGS: Left total hip arthroplasty in place. Acetabular and femoral components appear well seated. No caries prosthetic fracture or other complication. Postoperative soft tissue swelling with skin staples  noted. IMPRESSION: Satisfactory postoperative appearance status post left total hip arthroplasty. Electronically Signed   By: Jeannine Boga M.D.   On: 05/27/2016 16:18    Disposition: 06-Home-Health Care Svc    Follow-up Information    Hessie Knows, MD Follow up in 2 week(s).   Specialty:  Orthopedic Surgery Contact information: [place] [id number] [phone]            Signed: [place] 05/29/2016, 7:54 AM

## 2016-05-29 NOTE — Progress Notes (Signed)
Physical Therapy Treatment Patient Details Name: Travis Ford MRN: [id number] DOB: [birth year] Today's Date: 05/29/2016    History of Present Illness Pt is a 61 y.o. male s/p elective L THA anterior approach 05/27/16.  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    PT Comments    Pt able to navigate 4 stairs with railing CGA and ambulated around nursing loop SBA with RW.  Pt c/o 10/10 "burning" L hip pain with ambulation (although pt did not want to stop walking) and pain 2/10 resting in chair.  Pt did not want to attempt getting back into bed this morning d/t the L hip pain.  Pt reports notifying orthopedic PA this morning about his "burning" L hip pain.  Pt's BP 121/71 prior to functional mobility and 120/75 end of therapy session.  Will continue to progress pt with LE strengthening and increasing independence with functional mobility per pt tolerance.   Follow Up Recommendations  Home health PT     Equipment Recommendations  Rolling walker with 5" wheels    Recommendations for Other Services       Precautions / Restrictions Precautions Precautions: Fall;Anterior Hip Restrictions Weight Bearing Restrictions: Yes LLE Weight Bearing: Weight bearing as tolerated    Mobility  Bed Mobility               General bed mobility comments: Deferred d/t pt sitting up in chair beginning and end of session; pt also declining d/t pain.  Transfers Overall transfer level: Modified independent Equipment used: Rolling walker (2 wheeled) Transfers: Sit to/from Stand Sit to Stand: Supervision         General transfer comment: strong steady stand with RW; min vc's to control pt descent sitting into chair  Ambulation/Gait Ambulation/Gait assistance: Supervision Ambulation Distance (Feet): 250 Feet Assistive device: Rolling walker (2 wheeled)   Gait velocity: mildly decreased   General Gait Details: mild decreased stance time L LE; steady; occasional vc's to increase L hip flexion  and lead more with L knee   Stairs Stairs: Yes   Stair Management: One rail Right;Step to pattern;Forwards Number of Stairs: 4 General stair comments: steady; pt able to verbalize and demonstrate appropriate stepping technique without any cueing  Wheelchair Mobility    Modified [name] (Stroke Patients Only)       Balance Overall balance assessment: Needs assistance Sitting-balance support: No upper extremity supported;Feet supported Sitting balance-Leahy Scale: Normal Sitting balance - Comments: reaching outside BOS   Standing balance support: No upper extremity supported Standing balance-Leahy Scale: Good Standing balance comment: standing pulling up shorts pt steady without loss of balance                            Cognition Arousal/Alertness: Awake/alert Behavior During Therapy: WFL for tasks assessed/performed Overall Cognitive Status: Within Functional Limits for tasks assessed                                        Exercises General Exercises - Lower Extremity Long Arc Quad: AROM;Strengthening;Both;10 reps;Seated Hip Flexion/Marching: Strengthening;Both;10 reps;Seated (AROM R; AAROM L)     General Comments General comments (skin integrity, edema, etc.): Pt's wife present during session.  Pt agreeable to PT session.      Pertinent Vitals/Pain Pain Assessment: 0-10 Pain Score: 10-Worst pain ever (10/10 "burning" pain with ambulation but 2/10 at rest)  Pain Location: L hip Pain Descriptors / Indicators: Burning Pain Intervention(s): Limited activity within patient's tolerance;Monitored during session;Premedicated before session;Repositioned;Ice applied  Vitals (HR, O2, and BP) stable and WFL throughout treatment session.    Home Living                      Prior Function            PT Goals (current goals can now be found in the care plan section) Acute Rehab PT Goals Patient Stated Goal: to go home PT Goal  Formulation: With patient Time For Goal Achievement: 06/11/16 Potential to Achieve Goals: Good Progress towards PT goals: Progressing toward goals    Frequency    BID      PT Plan Current plan remains appropriate    Co-evaluation              AM-PAC PT "6 Clicks" Daily Activity  Outcome Measure  Difficulty turning over in bed (including adjusting bedclothes, sheets and blankets)?: A Little Difficulty moving from lying on back to sitting on the side of the bed? : A Little Difficulty sitting down on and standing up from a chair with arms (e.g., wheelchair, bedside commode, etc,.)?: A Little Help needed moving to and from a bed to chair (including a wheelchair)?: A Little Help needed walking in hospital room?: A Little Help needed climbing 3-5 steps with a railing? : A Little 6 Click Score: 18    End of Session Equipment Utilized During Treatment: Gait belt Activity Tolerance: Patient limited by pain Patient left: in chair;with call bell/phone within reach;with chair alarm set;with family/visitor present;with SCD's reapplied (B heels elevated via pillows; ice packs in place) Nurse Communication: Mobility status;Precautions (Pt's BP during session) PT Visit Diagnosis: Other abnormalities of gait and mobility (R26.89);Pain Pain - Right/Left: Left Pain - part of body: Hip     Time: 0825-0855 PT Time Calculation (min) (ACUTE ONLY): 30 min  Charges:  $Gait Training: 8-22 mins $Therapeutic Activity: 8-22 mins                    G CodesLeitha Bleak, PT 05/29/16, 10:54 AM [phone]

## 2018-04-03 IMAGING — DX DG HIP (WITH OR WITHOUT PELVIS) 2-3V*R*
2 series · 2 of 2 positions shown · non-contrast
Comparison: Intraoperative right hip radiographs from earlier today

CLINICAL DATA: Status post anterior right hip replacement

EXAM:
DG HIP (WITH OR WITHOUT PELVIS) 2-3V RIGHT

[hip ap]
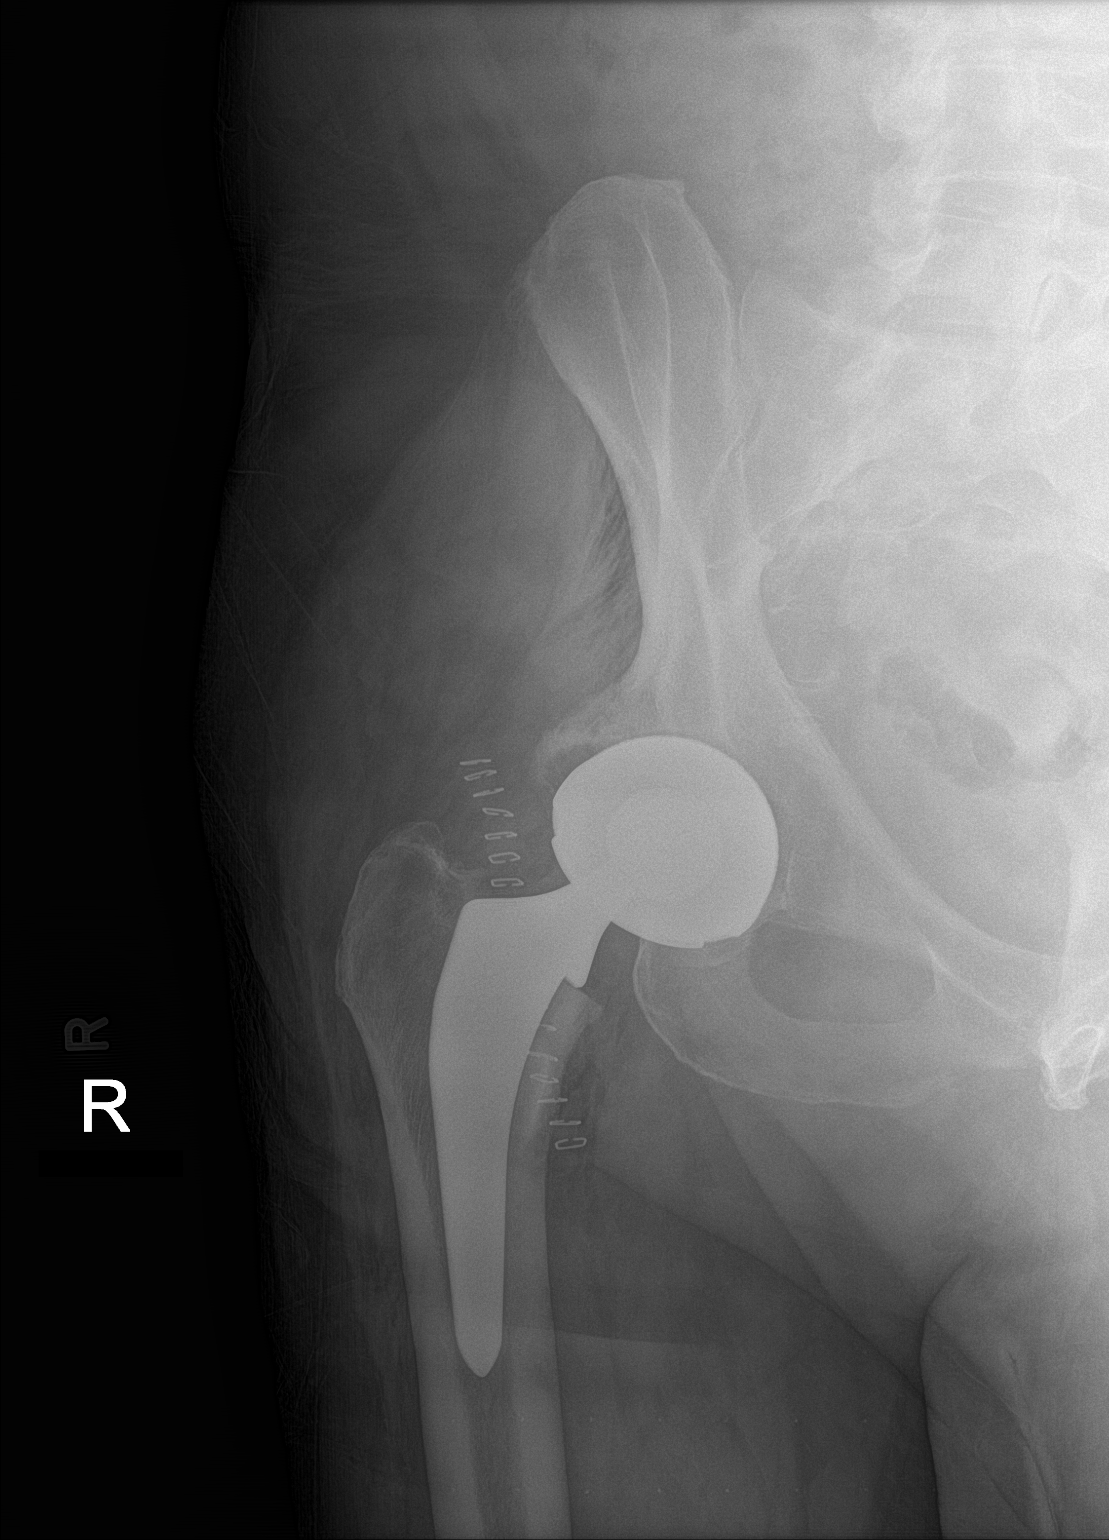

[hip lat]
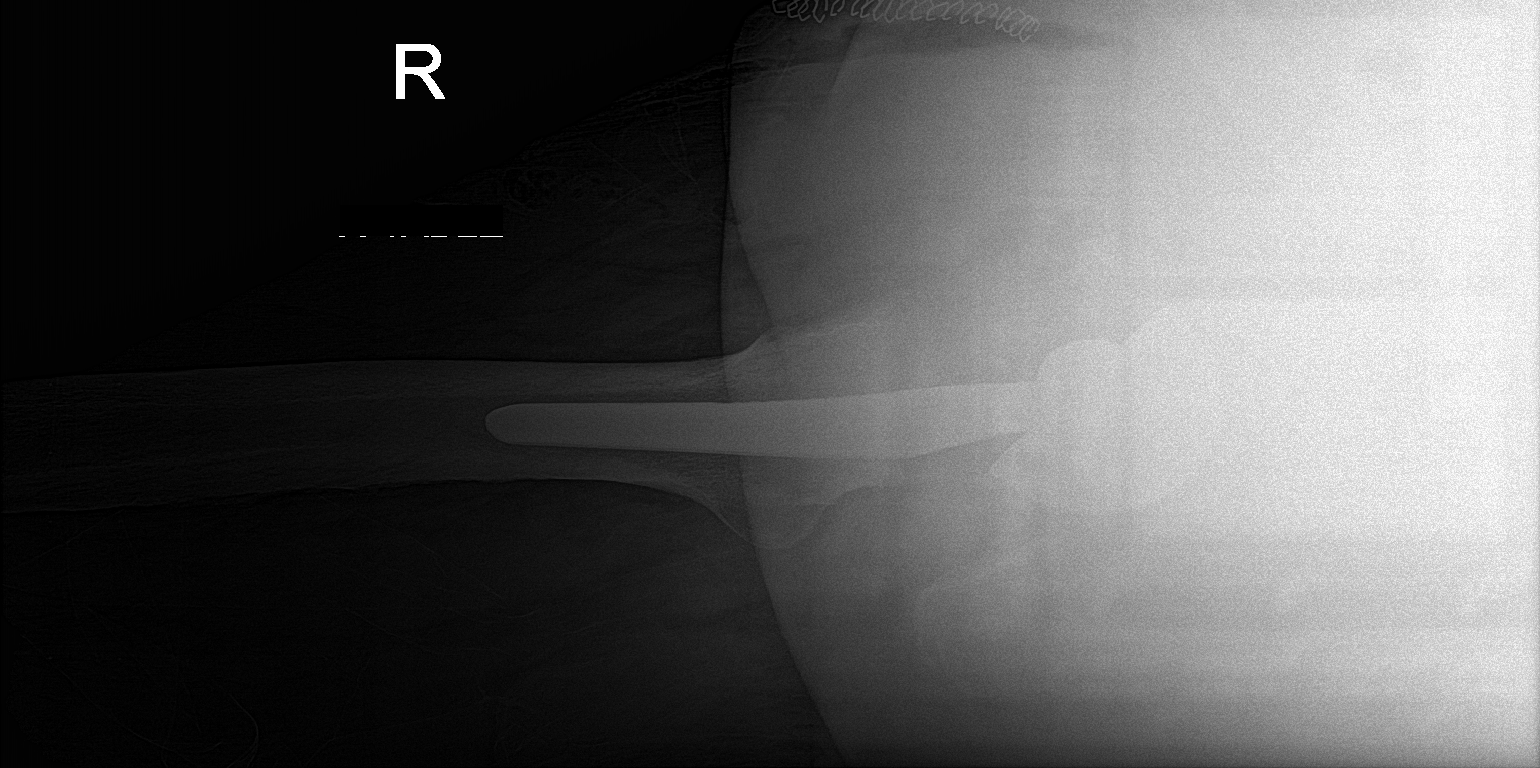

[2 of 2 positions shown; findings below may reference images not displayed]

FINDINGS: Status post right total hip arthroplasty, with well-positioned right
acetabular and right proximal femoral prostheses, with no evidence
of hardware fracture or loosening. No right hip dislocation. No
acute osseous fracture. No suspicious focal osseous lesions.
Expected soft tissue gas surrounding the right hip joint. Skin
staples overlie the right hip.
IMPRESSION: Satisfactory immediate postoperative appearance status post right
total hip arthroplasty.

## 2019-11-12 ENCOUNTER — Ambulatory Visit: Payer: BC Managed Care – PPO | Attending: Internal Medicine

## 2019-11-12 DIAGNOSIS — Z23 Encounter for immunization: Secondary | ICD-10-CM

## 2019-11-12 NOTE — Progress Notes (Signed)
   Covid-19 Vaccination Clinic  Name:  Travis Ford    MRN: [id number] DOB: 11-Mar-1955  11/12/2019  Travis Ford was observed post Covid-19 immunization for 15 minutes without incident. He was provided with Vaccine Information Sheet and instruction to access the V-Safe system.   Travis Ford was instructed to call 911 with any severe reactions post vaccine: Marland Kitchen Difficulty breathing  . Swelling of face and throat  . A fast heartbeat  . A bad rash all over body  . Dizziness and weakness

## 2020-11-29 ENCOUNTER — Ambulatory Visit: Payer: BC Managed Care – PPO

## 2020-11-30 ENCOUNTER — Ambulatory Visit: Payer: Self-pay | Attending: Internal Medicine

## 2020-11-30 DIAGNOSIS — Z23 Encounter for immunization: Secondary | ICD-10-CM

## 2020-11-30 NOTE — Progress Notes (Signed)
   Covid-19 Vaccination Clinic  Name:  Travis Ford    MRN: [id number] DOB: 08/02/1955  11/30/2020  Mr. [name] was observed post Covid-19 immunization for 15 minutes without incident. He was provided with Vaccine Information Sheet and instruction to access the V-Safe system.   Mr. [name] was instructed to call 911 with any severe reactions post vaccine: Difficulty breathing  Swelling of face and throat  A fast heartbeat  A bad rash all over body  Dizziness and weakness   Immunizations Administered     Name Date Dose VIS Date Route   Pfizer Covid-19 Vaccine Bivalent Booster 11/30/2020  9:52 AM 0.3 mL 09/12/2020 Intramuscular   Manufacturer: [employer]   Lot: [id number]   [place]: [place], PharmD, MBA Clinical Acute Care Pharmacist

## 2020-12-04 ENCOUNTER — Other Ambulatory Visit: Payer: Self-pay

## 2020-12-04 MED ORDER — PFIZER COVID-19 VAC BIVALENT 30 MCG/0.3ML IM SUSP
INTRAMUSCULAR | 0 refills | Status: AC
  Filled 2020-12-04: qty 0.3, 1d supply, fill #0

## 2021-03-13 ENCOUNTER — Other Ambulatory Visit: Payer: Self-pay

## 2021-11-21 ENCOUNTER — Ambulatory Visit
Admission: RE | Admit: 2021-11-21 | Discharge: 2021-11-21 | Disposition: A | Discharge status: Home / Self Care | Payer: Medicare Other | Source: Ambulatory Visit | Attending: Physician Assistant | Admitting: Physician Assistant

## 2021-11-21 ENCOUNTER — Other Ambulatory Visit: Payer: Self-pay | Admitting: Physician Assistant

## 2021-11-21 DIAGNOSIS — J189 Pneumonia, unspecified organism: Secondary | ICD-10-CM | POA: Diagnosis present

## 2021-11-21 DIAGNOSIS — R042 Hemoptysis: Secondary | ICD-10-CM | POA: Insufficient documentation

## 2021-11-21 LAB — POCT I-STAT CREATININE: Creatinine, Ser: 0.9 mg/dL (ref 0.61–1.24)

## 2021-11-21 MED ORDER — IOHEXOL 300 MG/ML  SOLN
75.0000 mL | Freq: Once | INTRAMUSCULAR | Status: AC | PRN
  Administered 2021-11-21: 75 mL via INTRAVENOUS

## 2022-06-07 ENCOUNTER — Emergency Department
Admission: EM | Admit: 2022-06-07 | Discharge: 2022-06-07 | Disposition: A | Discharge status: Home / Self Care | Payer: Medicare Other | Attending: Student in an Organized Health Care Education/Training Program | Admitting: Student in an Organized Health Care Education/Training Program

## 2022-06-07 ENCOUNTER — Other Ambulatory Visit: Payer: Self-pay

## 2022-06-07 DIAGNOSIS — L299 Pruritus, unspecified: Secondary | ICD-10-CM | POA: Diagnosis present

## 2022-06-07 DIAGNOSIS — T782XXA Anaphylactic shock, unspecified, initial encounter: Secondary | ICD-10-CM | POA: Insufficient documentation

## 2022-06-07 DIAGNOSIS — T63443A Toxic effect of venom of bees, assault, initial encounter: Secondary | ICD-10-CM | POA: Insufficient documentation

## 2022-06-07 MED ORDER — PREDNISONE 20 MG PO TABS: 40.0000 mg | ORAL_TABLET | Freq: Every day | ORAL | 0 refills | Status: AC

## 2022-06-07 MED ORDER — EPINEPHRINE 0.3 MG/0.3ML IJ SOAJ
0.3000 mg | Freq: Once | INTRAMUSCULAR | Status: AC
  Administered 2022-06-07: 0.3 mg via INTRAMUSCULAR

## 2022-06-07 MED ORDER — EPINEPHRINE 0.3 MG/0.3ML IJ SOAJ: 0.3000 mg | INTRAMUSCULAR | 1 refills | Status: AC | PRN

## 2022-06-07 MED ORDER — FAMOTIDINE IN NACL 20-0.9 MG/50ML-% IV SOLN
20.0000 mg | Freq: Once | INTRAVENOUS | Status: AC
  Administered 2022-06-07: 20 mg via INTRAVENOUS
  Filled 2022-06-07: qty 50

## 2022-06-07 MED ORDER — METHYLPREDNISOLONE SODIUM SUCC 125 MG IJ SOLR
125.0000 mg | Freq: Once | INTRAMUSCULAR | Status: AC
  Administered 2022-06-07: 125 mg via INTRAVENOUS
  Filled 2022-06-07: qty 2

## 2022-06-07 NOTE — ED Provider Notes (Signed)
Patient remained stable feeling well normal vital signs no recurrence of symptoms after period of observation status posttreatment for anaphylaxis.  Plan is for discharge now.   Pilar Jarvis, MD 06/07/22 [phone]

## 2022-06-07 NOTE — ED Triage Notes (Signed)
Pt presents to ED with c/o of multiple bee stings PTA. Pt has swelling to lips and states throat is scratchy at this time.  Wife gave 50mg  of PO benadryl about 25 minutes ago.

## 2022-06-07 NOTE — ED Provider Notes (Signed)
[facility] Provider Note    Event Date/Time   First MD Initiated Contact with Patient 06/07/22 1234     (approximate)   History   Insect Bite   HPI  Travis Ford is a 67 y.o. male presents to the ER for evaluation of swelling itchiness and scratchy throat after he was assaulted by bees.  Stung multiple times.  Roughly an hour prior to arrival.  Has had significant reactions to bee stings in the past.  Does not recall ever being diagnosed with anaphylaxis.  No vomiting no diarrhea.     Physical Exam   Triage Vital Signs: ED Triage Vitals [06/07/22 1232]  Enc Vitals Group     BP (!) 149/91     Pulse Rate 87     Resp 20     Temp 98 F (36.7 C)     Temp Source Oral     SpO2 94 %     Weight      Height      Head Circumference      Peak Flow      Pain Score 0     Pain Loc      Pain Edu?      Excl. in GC?     Most recent vital signs: Vitals:   06/07/22 1241 06/07/22 1245  BP:  (!) 162/79  Pulse: 78 79  Resp: (!) 24 19  Temp:    SpO2: 95% 97%     Constitutional: Alert  Eyes: Conjunctivae are normal.  Head: Atraumatic. Nose: No congestion/rhinnorhea. Mouth/Throat: Mucous membranes are moist.  Angioedema of the lips.  Trace uvular edema. Neck: Painless ROM.  Cardiovascular:   Good peripheral circulation. Respiratory: Normal respiratory effort.  No retractions.  Gastrointestinal: Soft and nontender.  Musculoskeletal:  no deformity Neurologic:  MAE spontaneously. No gross focal neurologic deficits are appreciated.  Skin:  Skin is warm, dry and intact.  Diffuse urticarial eruption. Psychiatric: Mood and affect are normal. Speech and behavior are normal.    ED Results / Procedures / Treatments   Labs (all labs ordered are listed, but only abnormal results are displayed) Labs Reviewed - No data to display   EKG     RADIOLOGY    PROCEDURES:  Critical Care performed: Yes, see critical care procedure note(s)  .Critical  Care  Performed by: Willy Eddy, MD Authorized by: Willy Eddy, MD   Critical care provider statement:    Critical care time (minutes):  35   Critical care was necessary to treat or prevent imminent or life-threatening deterioration of the following conditions: anaphylaxis.   Critical care was time spent personally by me on the following activities:  Ordering and performing treatments and interventions, ordering and review of laboratory studies, ordering and review of radiographic studies, pulse oximetry, re-evaluation of patient's condition, review of old charts, obtaining history from patient or surrogate, examination of patient, evaluation of patient's response to treatment, discussions with primary provider, discussions with consultants and development of treatment plan with patient or surrogate    MEDICATIONS ORDERED IN ED: Medications  EPINEPHrine (EPI-PEN) injection 0.3 mg (0.3 mg Intramuscular Given 06/07/22 1235)  methylPREDNISolone sodium succinate (SOLU-MEDROL) 125 mg/2 mL injection 125 mg (125 mg Intravenous Given 06/07/22 1246)  famotidine (PEPCID) IVPB 20 mg premix (20 mg Intravenous New Bag/Given 06/07/22 1246)     IMPRESSION / MDM / ASSESSMENT AND PLAN / ED COURSE  I reviewed the triage vital signs and the nursing notes.  Differential diagnosis includes, but is not limited to, anaphylaxis, anaphylactoid, urticaria eruption, bee sting, cellulitis  Patient presented to ER with evidence of anaphylaxis after multiple bee stings.  Currently protecting his airway given EpiPen.  Will be given IV steroid and Pepcid.  Already received Benadryl prior to arrival.  Will observe on cardiac monitor.   Clinical Course as of 06/07/22 1330  Sat Jun 07, 2022  1329 Patient reassessed and is feeling improved.  Angioedema resolving.  Urticaria also resolving.  Will plan to observe until 430 and if stable will be appropriate for outpatient follow-up.   Patient signed out to oncoming physician. [PR]    Clinical Course User Index [PR] Willy Eddy, MD     FINAL CLINICAL IMPRESSION(S) / ED DIAGNOSES   Final diagnoses:  Anaphylaxis, initial encounter  Bee sting reaction, assault, initial encounter     Rx / DC Orders   ED Discharge Orders     None        Note:  This document was prepared using Dragon voice recognition software and may include unintentional dictation errors.    Willy Eddy, MD 06/07/22 1330

## 2022-06-07 NOTE — ED Notes (Signed)
EDP at [facility], pt alert, NAD, calm, interactive, resps e/u, facial and oral swelling significant, epi-pen given R thigh. Airway patent. Skin W&D.
